# Patient Record
Sex: Male | Born: 1950 | Race: White | Hispanic: No | Smoking: Never smoker
Health system: Southern US, Community
[De-identification: ages and names within clinical notes are randomized; demographics above are authoritative.]

## PROBLEM LIST (undated history)

## (undated) DIAGNOSIS — I10 Essential (primary) hypertension: Secondary | ICD-10-CM

## (undated) DIAGNOSIS — E119 Type 2 diabetes mellitus without complications: Secondary | ICD-10-CM

## (undated) HISTORY — DX: Type 2 diabetes mellitus without complications: E11.9

## (undated) HISTORY — DX: Essential (primary) hypertension: I10

## (undated) HISTORY — PX: HERNIA REPAIR: SHX51

## (undated) HISTORY — PX: COLONOSCOPY: SHX174

---

## 2005-12-30 ENCOUNTER — Encounter (INDEPENDENT_AMBULATORY_CARE_PROVIDER_SITE_OTHER): Payer: Self-pay | Admitting: General Surgery

## 2005-12-30 ENCOUNTER — Ambulatory Visit (HOSPITAL_COMMUNITY): Admission: RE | Admit: 2005-12-30 | Discharge: 2005-12-30 | Payer: Self-pay | Admitting: General Surgery

## 2006-08-25 ENCOUNTER — Ambulatory Visit (HOSPITAL_COMMUNITY): Admission: RE | Admit: 2006-08-25 | Discharge: 2006-08-25 | Payer: Self-pay | Admitting: Family Medicine

## 2007-11-14 ENCOUNTER — Ambulatory Visit (HOSPITAL_COMMUNITY): Payer: Self-pay | Admitting: Psychiatry

## 2007-11-22 ENCOUNTER — Ambulatory Visit (HOSPITAL_COMMUNITY): Payer: Self-pay | Admitting: Psychiatry

## 2007-11-29 ENCOUNTER — Ambulatory Visit (HOSPITAL_COMMUNITY): Payer: Self-pay | Admitting: Psychiatry

## 2007-12-05 ENCOUNTER — Ambulatory Visit (HOSPITAL_COMMUNITY): Payer: Self-pay | Admitting: Psychiatry

## 2007-12-13 ENCOUNTER — Ambulatory Visit (HOSPITAL_COMMUNITY): Payer: Self-pay | Admitting: Psychiatry

## 2007-12-22 ENCOUNTER — Ambulatory Visit (HOSPITAL_COMMUNITY): Payer: Self-pay | Admitting: Psychiatry

## 2007-12-27 ENCOUNTER — Ambulatory Visit (HOSPITAL_COMMUNITY): Payer: Self-pay | Admitting: Psychiatry

## 2007-12-30 ENCOUNTER — Ambulatory Visit (HOSPITAL_COMMUNITY): Payer: Self-pay | Admitting: Psychiatry

## 2008-01-03 ENCOUNTER — Ambulatory Visit (HOSPITAL_COMMUNITY): Payer: Self-pay | Admitting: Psychiatry

## 2008-01-06 ENCOUNTER — Ambulatory Visit (HOSPITAL_COMMUNITY): Payer: Self-pay | Admitting: Psychiatry

## 2008-01-10 ENCOUNTER — Ambulatory Visit (HOSPITAL_COMMUNITY): Payer: Self-pay | Admitting: Psychiatry

## 2008-01-13 ENCOUNTER — Ambulatory Visit (HOSPITAL_COMMUNITY): Payer: Self-pay | Admitting: Psychiatry

## 2008-01-17 ENCOUNTER — Ambulatory Visit (HOSPITAL_COMMUNITY): Payer: Self-pay | Admitting: Psychiatry

## 2008-01-23 ENCOUNTER — Ambulatory Visit (HOSPITAL_COMMUNITY): Payer: Self-pay | Admitting: Psychiatry

## 2008-01-27 ENCOUNTER — Ambulatory Visit (HOSPITAL_COMMUNITY): Payer: Self-pay | Admitting: Psychiatry

## 2008-02-02 ENCOUNTER — Ambulatory Visit (HOSPITAL_COMMUNITY): Payer: Self-pay | Admitting: Psychiatry

## 2008-02-06 ENCOUNTER — Ambulatory Visit (HOSPITAL_COMMUNITY): Payer: Self-pay | Admitting: Psychiatry

## 2008-02-16 ENCOUNTER — Other Ambulatory Visit (HOSPITAL_COMMUNITY): Admission: RE | Admit: 2008-02-16 | Discharge: 2008-03-16 | Payer: Self-pay | Admitting: Psychiatry

## 2008-02-20 ENCOUNTER — Ambulatory Visit: Payer: Self-pay | Admitting: Psychiatry

## 2008-03-15 ENCOUNTER — Ambulatory Visit (HOSPITAL_COMMUNITY): Payer: Self-pay | Admitting: Psychiatry

## 2008-03-20 ENCOUNTER — Ambulatory Visit (HOSPITAL_COMMUNITY): Payer: Self-pay | Admitting: Psychiatry

## 2008-03-27 ENCOUNTER — Ambulatory Visit (HOSPITAL_COMMUNITY): Payer: Self-pay | Admitting: Psychiatry

## 2008-04-03 ENCOUNTER — Ambulatory Visit (HOSPITAL_COMMUNITY): Payer: Self-pay | Admitting: Psychiatry

## 2008-04-10 ENCOUNTER — Ambulatory Visit (HOSPITAL_COMMUNITY): Payer: Self-pay | Admitting: Psychiatry

## 2008-04-16 ENCOUNTER — Inpatient Hospital Stay (HOSPITAL_COMMUNITY): Admission: RE | Admit: 2008-04-16 | Discharge: 2008-04-18 | Payer: Self-pay | Admitting: *Deleted

## 2008-04-16 ENCOUNTER — Emergency Department (HOSPITAL_COMMUNITY): Admission: EM | Admit: 2008-04-16 | Discharge: 2008-04-16 | Payer: Self-pay | Admitting: Emergency Medicine

## 2008-04-16 ENCOUNTER — Ambulatory Visit: Payer: Self-pay | Admitting: *Deleted

## 2008-04-24 ENCOUNTER — Other Ambulatory Visit (HOSPITAL_COMMUNITY): Admission: RE | Admit: 2008-04-24 | Discharge: 2008-07-23 | Payer: Self-pay | Admitting: Psychiatry

## 2008-05-14 ENCOUNTER — Ambulatory Visit: Payer: Self-pay | Admitting: Psychiatry

## 2008-12-08 ENCOUNTER — Emergency Department (HOSPITAL_COMMUNITY): Admission: EM | Admit: 2008-12-08 | Discharge: 2008-12-08 | Payer: Self-pay | Admitting: Emergency Medicine

## 2008-12-13 ENCOUNTER — Ambulatory Visit: Admission: RE | Admit: 2008-12-13 | Discharge: 2008-12-13 | Payer: Self-pay | Admitting: Family Medicine

## 2009-10-29 ENCOUNTER — Ambulatory Visit (HOSPITAL_COMMUNITY): Admission: RE | Admit: 2009-10-29 | Discharge: 2009-10-29 | Payer: Self-pay | Admitting: General Surgery

## 2010-05-27 ENCOUNTER — Ambulatory Visit (HOSPITAL_COMMUNITY): Admission: RE | Admit: 2010-05-27 | Discharge: 2010-05-27 | Payer: Self-pay | Admitting: Psychiatry

## 2010-05-28 ENCOUNTER — Other Ambulatory Visit (HOSPITAL_COMMUNITY): Admission: RE | Admit: 2010-05-28 | Discharge: 2010-06-10 | Payer: Self-pay | Admitting: Psychiatry

## 2010-06-12 ENCOUNTER — Ambulatory Visit: Payer: Self-pay | Admitting: Psychiatry

## 2011-02-20 ENCOUNTER — Other Ambulatory Visit: Payer: Self-pay | Admitting: General Surgery

## 2011-02-20 ENCOUNTER — Encounter (HOSPITAL_COMMUNITY): Payer: 59

## 2011-02-20 LAB — CBC
MCH: 31.4 pg (ref 26.0–34.0)
MCV: 90.3 fL (ref 78.0–100.0)
Platelets: 191 10*3/uL (ref 150–400)
RDW: 13.6 % (ref 11.5–15.5)

## 2011-02-20 LAB — BASIC METABOLIC PANEL
BUN: 12 mg/dL (ref 6–23)
Creatinine, Ser: 0.98 mg/dL (ref 0.4–1.5)
GFR calc non Af Amer: >60 mL/min (ref >60)

## 2011-02-20 LAB — SURGICAL PCR SCREEN: Staphylococcus aureus: NEGATIVE

## 2011-02-25 ENCOUNTER — Ambulatory Visit (HOSPITAL_COMMUNITY)
Admission: RE | Admit: 2011-02-25 | Discharge: 2011-02-25 | Disposition: A | Discharge status: Home / Self Care | Payer: 59 | Source: Ambulatory Visit | Attending: General Surgery | Admitting: General Surgery

## 2011-02-25 DIAGNOSIS — Z79899 Other long term (current) drug therapy: Secondary | ICD-10-CM | POA: Insufficient documentation

## 2011-02-25 DIAGNOSIS — I10 Essential (primary) hypertension: Secondary | ICD-10-CM | POA: Insufficient documentation

## 2011-02-25 DIAGNOSIS — Z01812 Encounter for preprocedural laboratory examination: Secondary | ICD-10-CM | POA: Insufficient documentation

## 2011-02-25 DIAGNOSIS — Z01818 Encounter for other preprocedural examination: Secondary | ICD-10-CM | POA: Insufficient documentation

## 2011-02-25 DIAGNOSIS — K432 Incisional hernia without obstruction or gangrene: Secondary | ICD-10-CM | POA: Insufficient documentation

## 2011-02-26 NOTE — H&P (Signed)
  NAME:  Christian Woodward, MIRARCHI NO.:  [id number]  MEDICAL RECORD NO.:  [id number]           PATIENT TYPE:  LOCATION:                                 FACILITY:  PHYSICIAN:  Dalia Heading, M.D.  DATE OF BIRTH:  June 11, 1951  DATE OF ADMISSION: DATE OF DISCHARGE:  LH                             HISTORY & PHYSICAL   CHIEF COMPLAINT:  Incisional hernia.  HISTORY OF PRESENT ILLNESS:  The patient is a 60 year old white male who is referred for evaluation and treatment of an incisional hernia.  He had an umbilical herniorrhaphy by another physician approximately 3-4 years ago.  The repair has since failed.  It is made worse with straining.  PAST MEDICAL HISTORY:  Includes a history of alcoholism, though he has stopped.  History of pulmonary emboli in January 2010.  CURRENT MEDICATIONS:  Abilify, Levitra.  ALLERGIES:  No known drug allergies.  REVIEW OF SYSTEMS:  The patient denies drinking or smoking.  Denies any other cardiopulmonary difficulties or bleeding disorders.  FAMILY MEDICAL HISTORY:  Noncontributory.  PHYSICAL EXAMINATION:  GENERAL:  The patient is a well-developed, well- nourished white male, in no acute distress. HEENT:  Reveals no scleral icterus. LUNGS:  Clear to auscultation with equal breath sounds bilaterally. HEART:  Reveals regular rate and rhythm without S3, S4, murmurs. ABDOMEN:  Soft, nontender, nondistended.  No hepatosplenomegaly or masses are noted.  A reducible recurrent umbilical hernia is noted.  IMPRESSION:  Incisional hernia, umbilical.  PLAN:  The patient is scheduled for an incisional herniorrhaphy with mesh on February 25, 2011.  The risks and benefits of the procedure including bleeding, infection, and recurrence of the hernia were fully explained to the patient, gave informed consent.     Dalia Heading, M.D.     MAJ/MEDQ  D:  02/17/2011  T:  02/18/2011  Job:  [id number]  cc:   Short Stay, Gary Fleet. Sudie Bailey,  M.D. Fax: [phone]  Electronically Signed by Franky Macho M.D. on 02/26/2011 10:59:18 AM

## 2011-02-26 NOTE — Op Note (Signed)
  NAME:  Christian Woodward, Christian Woodward NO.:  [id number]  MEDICAL RECORD NO.:  [id number]           PATIENT TYPE:  O  LOCATION:  DAYP                          FACILITY:  [facility]  PHYSICIAN:  Dalia Heading, M.D.  DATE OF BIRTH:  10-07-1951  DATE OF PROCEDURE:  02/25/2011 DATE OF DISCHARGE:                              OPERATIVE REPORT   PREOPERATIVE DIAGNOSIS:  Incisional hernia.  POSTOPERATIVE DIAGNOSIS:  Incisional hernia.  PROCEDURE:  Incisional herniorrhaphy with mesh.  SURGEON:  Dalia Heading, MD  ANESTHESIA:  General.  INDICATIONS:  The patient is a 60 year old white male who presents with incisional hernia.  He had a previous umbilical herniorrhaphy without mesh in the past by another surgeon.  The risks and benefits of the procedure including bleeding, infection, the possibility of recurrence of the hernia were fully explained to the patient, he gave informed consent.  PROCEDURE NOTE:  The patient was placed in the supine position.  After general anesthesia was administered, the abdomen was prepped and draped using the usual sterile technique with DuraPrep.  Surgical site confirmation was performed.  An infraumbilical incision was made through the previous surgical scar. The dissection was taken down to the fascia.  The umbilicus was freed away from the underlying fascia and hernia sac.  The patient had 2-3 areas of abdominal wall defects.  These were all connected into one. Some the omentum was excised and freed away from the underlying abdominal wall as well as the hernia sac.  The omental contents were then reduced.  A large-size Proceed ventral patch was then inserted and secured to the fascia using 2-0 Ethibond interrupted sutures.  Some of the soft tissue was then closed over the patch using 2-0 Novofil interrupted sutures.  The base of the umbilicus was secured back to the fascia using 2-0 Vicryl interrupted suture.  Subcutaneous layer  was reapproximated using 3-0 Vicryl interrupted sutures.  The skin was closed using staples.  Sensorcaine 0.5% was instilled into the surrounding wound.  Betadine ointment and dry sterile dressing were applied.  All tape and needle counts were correct at the end of the procedure. The patient was awakened and transferred to PACU in stable condition.  COMPLICATIONS:  None.  SPECIMEN:  None.  ESTIMATED BLOOD LOSS:  Minimal.     Dalia Heading, M.D.     MAJ/MEDQ  D:  02/25/2011  T:  02/25/2011  Job:  [id number]  cc:   Mila Homer. Sudie Bailey, M.D. Fax: [phone]  Electronically Signed by Franky Macho M.D. on 02/26/2011 10:59:20 AM

## 2011-04-28 NOTE — Discharge Summary (Signed)
NAME:  ADITH, TEJADA NO.:  [id number]   MEDICAL RECORD NO.:  [id number]          PATIENT TYPE:  IPS   LOCATION:  0303                          FACILITY:  [facility]   PHYSICIAN:  Jasmine Pang, M.D. DATE OF BIRTH:  [birth year]   DATE OF ADMISSION:  04/16/2008  DATE OF DISCHARGE:  04/18/2008                               DISCHARGE SUMMARY   IDENTIFYING INFORMATION:  A 60 year old white male who is divorced.  This is an involuntary admission.   HISTORY OF PRESENT ILLNESS:  This patient was referred by his  psychotherapist, Florencia Reasons, in the [place] office of the [facility].  He had expressed to her ongoing thoughts of suicide.  Has been having these for about 2 weeks, and said that he had gone ahead  and given all of his guns to a friend.  Not sure that he could be safe  at home.  He was transferred over the [facility] Emergency Room and then  decided that he needed to go home, feed his cat, pack a bag.  Was told  he could not leave the emergency room and out of concern for his safety  he was involuntarily petitioned.  He is unable to cite any particular  stressors that he believes have triggered his depression.  He has been  on Cymbalta since he attended our intensive outpatient program at the  beginning of April and is currently taking 60 mg per day.  Has felt that  it has not been helpful and denies any hallucinations.  Does endorse  having decreased sleep with a lot of ruminating thought, wanting to  worry all night long, turning his worries over and over in his mind,  falling asleep late into the night, then awakens and says that his  worrying begins to escalate and gets very anxious in the morning after  he first wakes up facing the day.  Was prescribed Xanax 1 mg and takes 1  in the morning and typically 1 at night.  No new social stressors.  Denying any homicidal thoughts.  No hallucinations.  No subjective  feelings of paranoia.  Endorses a  lot of anhedonia, dysphoric mood,  feeling lonely about his future since divorcing his wife.   PAST PSYCHIATRIC HISTORY:  First inpatient psychiatric admission.  He  sees Florencia Reasons for counseling and Dr. Kathryne Sharper in the [facility] in [place].  First diagnosed with depression in  January of 2009 after his wife of 37 years divorced him.  He wanted to  maintain the relationship but she would not reconcile.  Denies any  history of mood disorder in childhood.  No history of learning  disability, brain injury or substance abuse.  Initially in January was  treated with Wellbutrin, which he felt did not work at all.  Also at one  point was treated with Lexapro, that he felt did not work.  Obtained  counseling in our intensive outpatient program in April and felt that  this was very helpful.  At that time was started on Cymbalta and is  taking 60 mg  daily.   SOCIAL HISTORY:  A baccalaureate degree in business management.  Works  full time for [employer] in [place].  Lives there in  his own home.  Has a supportive friend that he is quite close to,  cares  for his cat.  Endorses some social isolation.  No legal problems.  No  history of substance abuse.   MEDICAL HISTORY:  No regular primary care physician.  Denies chronic  medical problems.   CURRENT MEDICATIONS:  Xanax 1 mg up to t.i.d. p.r.n. for anxiety,  typically uses 1 a.m. and h.s.; Seroquel 100 mg p.o. q.h.s.; and  Cymbalta 60 mg daily.   DRUG ALLERGIES:  NO KNOWN DRUG ALLERGIES.   PHYSICAL EXAM:  Was done in the [facility] Emergency Room with  diagnostic studies including a CMET, CBC, and were all within normal  limits.   ADMITTING MENTAL STATUS EXAM:  Revealed a fully alert male, cooperative,  grim affect, quite angry about being involuntarily petitioned although  he was polite at all times and cooperative with staff.  Speech normal in  pace, tone and amount.  He is articulate.  In addition  to being  involuntary petitioned, he was apparently diverted to the jail where he  was cuffed and secured to a wall, unable to eat or drink for several  hours.  Cooperative with treatment though, willing to discuss some  alternatives for care.  Mood depressed and irritable.  Thought process  logical and coherent.  Denying any active suicidal intent today.  Admits  to having 2 weeks of vague suicidal thoughts.  Cognition was fully  intact.  He is in full contact with reality.  No inappropriate  statements, no evidence of psychosis.   ADMITTING DIAGNOSIS:  AXIS I:  Major depression, recurrent.  AXIS II:  Deferred.  AXIS III:  No diagnosis.  AXIS IV: Chronic issues with loneliness, grieving breakup of the  relationship, and some social isolation.  AXIS V:  Current 47, past year 54 estimated.   COURSE OF HOSPITALIZATION:  The patient was admitted to our depression  group with a goal of stabilizing him and alleviating his suicidal  thoughts.  Initially he was quite preoccupied with the involuntary  petition process, feeling that it had been unjust when he was willing to  sign himself voluntarily, so we released him from the involuntary  petition and he signed in voluntarily.  Reviewed current symptoms.  He  is not convinced that the Cymbalta was working.  He agreed to try Zoloft  and we started him on 50 mg q.a.m.  By the morning of May 6th, 2009 he  had slept fairly well, denying any intent to harm himself or anyone  else, willing to follow up as an outpatient, feeling that he could go  home and be safe.  Mental status exam revealed a fully alert male in  full contact with reality with neutral mood, linear thinking, logical,  coherent, denying any suicidal thought, wanting to return to our  intensive outpatient program and willing to start there on Apr 19, 2008  at 9 a.m.  Meanwhile, he had taken a dose of Zoloft with no adverse  reactions.  Insight good.  Impulse control and judgment  within normal  limits.  He was concerned about getting back to work, his truck was  parked at the hospital, securing his various personal possessions.  We  agreed to discharge him.   DISCHARGE MEDICATIONS:  Zoloft 50 mg daily,  Cymbalta is discontinued,  Seroquel 100 mg at bedtime, Xanax 1 mg up to 3 times a day as needed for  anxiety.      Margaret A. Lorin Picket, N.P.      Jasmine Pang, M.D.  Electronically Signed    MAS/MEDQ  D:  04/18/2008  T:  04/18/2008  Job:  [id number]

## 2011-04-28 NOTE — Procedures (Signed)
NAME:  Christian Woodward, Christian Woodward             ACCOUNT NO.:  [id number]   MEDICAL RECORD NO.:  [id number]          PATIENT TYPE:  OUT   LOCATION:  SLEE                          FACILITY:  [facility]   PHYSICIAN:  Kofi A. Gerilyn Pilgrim, M.D. DATE OF BIRTH:  03-Dec-1951   DATE OF PROCEDURE:  12/13/2008  DATE OF DISCHARGE:  12/13/2008                             SLEEP DISORDER REPORT   NOCTURNAL POLYSOMNOGRAPHY REPORT.   REFERRING PHYSICIAN:  Mila Homer. Sudie Bailey, MD   INDICATIONS:  This is a 60 year old man who presents with snoring,  witnessed apnea, daytime sleepiness.  He is being evaluated for  obstructive sleep apnea syndrome.   MEDICATIONS:  Alprazolam, Ambien, Risperdal, and trazodone.   EPWORTH SLEEPINESS SCALE:  1. BMI of 39.   ARCHITECTURAL SUMMARY:  This is a split-night study with the first  portion being a diagnostic and a second a titration.  The total  recording time and the diagnostics 243 minutes and 220 minutes in the  titration.  The sleep efficiency is 86% and the diagnostic 91% in the  titration.  The sleep latency is 8.5 minutes.  No REM is observed in the  diagnostic portion.   RESPIRATORY SUMMARY:  The baseline oxygen saturation is 94%, the lowest  saturation is 86%.  The AHI in the diagnostic portion is 17.  The  patient was titrated between pressures 6 and 11 with the pressure of 10  being the optimal pressure with resolution of events and snoring.   LIMB MOVEMENT SUMMARY:  PLM index 0.   ELECTROCARDIOGRAM SUMMARY:  Average heart rate is 89 with isolated PVCs  observed.   IMPRESSION:  Moderate obstructive sleep apnea syndrome which responds  well to a CPAP.   Thanks for this referral.      Kofi A. Gerilyn Pilgrim, M.D.  Electronically Signed    KAD/MEDQ  D:  12/16/2008  T:  12/16/2008  Job:  [id number]

## 2011-04-28 NOTE — Group Therapy Note (Signed)
NAME:  Christian Woodward, Christian Woodward NO.:  [id number]   MEDICAL RECORD NO.:  [id number]          PATIENT TYPE:  EMS   LOCATION:  ED                            FACILITY:  [facility]   PHYSICIAN:  Mila Homer. Sudie Bailey, M.D.DATE OF BIRTH:  November 04, 1951   DATE OF PROCEDURE:  DATE OF DISCHARGE:  12/08/2008                                 PROGRESS NOTE   SUBJECTIVE:  The patient's wife called me today saying that he had been  quite confused in the last few days, also been coughing.  I found that  later in the emergency room that his alprazolam 1 mg q.i.d. had been  changed to 2 mg q.i.d. about 3 weeks before at a mental health facility  he went to.  However, the patient said he was too knocked out by the  drug and actually only took it half a dose sometimes and just a full  dose at night.  He also had been put on trazodone for sleep, but felt  this was too expensive.  He still had risperidone 1 mg to use b.i.d.,  sertraline 100 mg daily, as well as the alprazolam.   The patient did admit to having a cough.  He really had no fever,  chills, no sputum production.  He has had long history of intermittent  depression very often related to job situation, some over use of  alcohol.  He has had some swelling of his feet and ankles for the last 2-  3 weeks which is new for him.   OBJECTIVE:  VITAL SIGNS:  Temperature 97.38, blood pressure 145/95,  pulse 101, respiratory rate 22, O2 sats 96% on  room air.  GENERAL:  He is well-developed, obese, middle-aged man in no acute  distress.  He is oriented and alert.  Sentence structure is intact.  Sensorium appears normal.  There is no slurring of his speech.  LUNGS:  Actually clear throughout.  He moves air well.  There are no  intercostal retractions.  There is no use of accessory muscle for  respiration.  HEART:  Regular rhythm without murmur, rate of about 80.  He does have  1+ pitting edema of the ankles and feet however.  Feet are warm.   LABORATORY DATA:  His CBC showed a white cell count of 6100 of which 58%  neutrophils, 31% lymphocytes.  His hemoglobin was 14.9.  Platelet count  to 204,000.  CMP showed BUN 14, creatinine 1.00, albumin 4.0.  Blood gas  on room air showed pH of 7.41, pCO2 33, pO2 78, bicarb 21.   ASSESSMENT:  1. Mild confusion probably secondary to over use of benzodiazepines.  2. Edema.  3. Reactive depression.  4. History of ethyl alcohol overuse.  5. Obesity.   PLAN:  I talked to the patient's wife at length.  I am putting him on  furosemide 40 mg daily for fluid, #30, no refills along KCl 20 mEq daily  with the furosemide 30 no refills.  I have also put him on temazepam 30  mg two nightly for  sleep, #60 with 5 refills, since this  has helped his sleep more than  anything.  He is to cut the alprazolam down to 1 mg half a tablet t.i.d.  and 2 mg nightly to use with the temazepam for sleep.  Followup with me  in the office within the next 1-2 weeks.      Mila Homer. Sudie Bailey, M.D.  Electronically Signed     SDK/MEDQ  D:  12/09/2008  T:  12/09/2008  Job:  [id number]

## 2011-05-01 NOTE — Op Note (Signed)
NAME:  Christian Woodward, Christian Woodward NO.:  [id number]   MEDICAL RECORD NO.:  [id number]          PATIENT TYPE:  AMB   LOCATION:  DAY                           FACILITY:  [facility]   PHYSICIAN:  Barbaraann Barthel, M.D. DATE OF BIRTH:  1951/08/01   DATE OF PROCEDURE:  12/30/2005  DATE OF DISCHARGE:                                 OPERATIVE REPORT   SURGEON:  Dr. Malvin Johns.   POSTOPERATIVE DIAGNOSIS:  Umbilical hernia   PROCEDURE:  Umbilical herniorrhaphy without the use of mesh.   SPECIMEN:  Umbilical hernia sac.   NOTE:  This is a 60 year old heavy-set white male who presented with an  umbilical hernia, and this appeared to be clinically quite larger than it  was found to be intraoperatively. He also had some erosion over the skin and  some attenuation of the skin which made me think that if mesh would be  needed I would, to avoid a greater probability of infection, have to remove  that area of skin. However, this was not necessary.   GROSS OPERATIVE FINDINGS:  The patient had a defect approximately the size  of 50 cent piece in a subcutaneous pocket where the hernia passed through  the abdominal cavity giving the impression that this was a larger hernia  than it indeed was. The skin was much healed and no removal of the umbilicus  was necessary.   TECHNIQUE:  The patient was placed in a supine position. After adequate  administration of general anesthesia via endotracheal intubation, his entire  abdomen was prepped with Betadine solution and draped in usual manner. A  curvilinear incision was carried out below the umbilicus through skin,  subcutaneous tissue down to the fascia. The hernia sac was dissected free  from the umbilical skin. The hernia sac was removed, and the fascia was  approximated with #1 Prolene sutures in a horizontal mattress suturing  technique. We then oversewed this with a 0 Prolene to do a two-layer  closure. After checking for hemostasis, the wound was  then irrigated. The  subcutaneous tissue was closed with 3-0 Polysorb and tacking the umbilicus  to the fascial tissues restoring its concave appearance. I used  approximately 8 cc of 1/2% Sensorcaine around the wound to help with  postoperative comfort. The skin was approximated with the stapling device.  Neosporin, 4x4s and a sterile dressing was applied. Prior to closure, all  sponge views were counts found to be correct. Estimated blood loss was  minimal. The patient received 650 cc of crystalloids intraoperatively. There  were no complications.      Barbaraann Barthel, M.D.  Electronically Signed    WB/MEDQ  D:  12/30/2005  T:  12/30/2005  Job:  [id number]

## 2011-09-07 LAB — URINE DRUGS OF ABUSE SCREEN W ALC, ROUTINE (REF LAB)
Amphetamine Screen, Ur: NEGATIVE
Barbiturate Quant, Ur: NEGATIVE
Benzodiazepines.: POSITIVE — AB
Cocaine Metabolites: NEGATIVE
Creatinine,U: 87.8
Ethyl Alcohol: <5
Marijuana Metabolite: NEGATIVE
Methadone: NEGATIVE
Opiate Screen, Urine: NEGATIVE
Phencyclidine (PCP): NEGATIVE
Propoxyphene: NEGATIVE

## 2011-09-07 LAB — BENZODIAZEPINE, QUANTITATIVE, URINE
Alprazolam (GC/LC/MS), ur confirm: 130 ng/mL
Flurazepam GC/MS Conf: NEGATIVE
Nordiazepam GC/MS Conf: NEGATIVE
Oxazepam GC/MS Conf: NEGATIVE

## 2011-09-18 LAB — COMPREHENSIVE METABOLIC PANEL
ALT: 30 U/L (ref 0–53)
AST: 25 U/L (ref 0–37)
Alkaline Phosphatase: 72 U/L (ref 39–117)
CO2: 23 mEq/L (ref 19–32)
Calcium: 9.1 mg/dL (ref 8.4–10.5)
Chloride: 106 mEq/L (ref 96–112)
GFR calc non Af Amer: >60 mL/min (ref >60)
Glucose, Bld: 214 mg/dL — ABNORMAL HIGH (ref 70–99)
Sodium: 137 mEq/L (ref 135–145)
Total Bilirubin: 0.4 mg/dL (ref 0.3–1.2)

## 2011-09-18 LAB — DIFFERENTIAL
Basophils Absolute: 0 10*3/uL (ref 0.0–0.1)
Basophils Relative: 1 % (ref 0–1)
Eosinophils Absolute: 0.1 10*3/uL (ref 0.0–0.7)
Eosinophils Relative: 2 % (ref 0–5)
Lymphs Abs: 1.9 10*3/uL (ref 0.7–4.0)
Neutrophils Relative %: 58 % (ref 43–77)

## 2011-09-18 LAB — CBC
Hemoglobin: 14.9 g/dL (ref 13.0–17.0)
MCHC: 34.3 g/dL (ref 30.0–36.0)
RBC: 4.82 MIL/uL (ref 4.22–5.81)
WBC: 6.1 10*3/uL (ref 4.0–10.5)

## 2011-09-18 LAB — BLOOD GAS, ARTERIAL
FIO2: 21 %
O2 Saturation: 96.1 %
Patient temperature: 37
pH, Arterial: 7.418 (ref 7.350–7.450)

## 2018-12-16 DIAGNOSIS — J189 Pneumonia, unspecified organism: Secondary | ICD-10-CM | POA: Diagnosis not present

## 2018-12-20 DIAGNOSIS — E781 Pure hyperglyceridemia: Secondary | ICD-10-CM | POA: Diagnosis not present

## 2018-12-20 DIAGNOSIS — I10 Essential (primary) hypertension: Secondary | ICD-10-CM | POA: Diagnosis not present

## 2018-12-20 DIAGNOSIS — G4700 Insomnia, unspecified: Secondary | ICD-10-CM | POA: Diagnosis not present

## 2018-12-20 DIAGNOSIS — E1165 Type 2 diabetes mellitus with hyperglycemia: Secondary | ICD-10-CM | POA: Diagnosis not present

## 2020-01-26 DIAGNOSIS — I10 Essential (primary) hypertension: Secondary | ICD-10-CM | POA: Diagnosis not present

## 2020-01-26 DIAGNOSIS — G4700 Insomnia, unspecified: Secondary | ICD-10-CM | POA: Diagnosis not present

## 2020-01-26 DIAGNOSIS — N529 Male erectile dysfunction, unspecified: Secondary | ICD-10-CM | POA: Diagnosis not present

## 2020-01-26 DIAGNOSIS — E1165 Type 2 diabetes mellitus with hyperglycemia: Secondary | ICD-10-CM | POA: Diagnosis not present

## 2020-01-29 DIAGNOSIS — I10 Essential (primary) hypertension: Secondary | ICD-10-CM | POA: Diagnosis not present

## 2020-01-29 DIAGNOSIS — E1165 Type 2 diabetes mellitus with hyperglycemia: Secondary | ICD-10-CM | POA: Diagnosis not present

## 2020-01-29 DIAGNOSIS — Z125 Encounter for screening for malignant neoplasm of prostate: Secondary | ICD-10-CM | POA: Diagnosis not present

## 2020-01-29 DIAGNOSIS — E781 Pure hyperglyceridemia: Secondary | ICD-10-CM | POA: Diagnosis not present

## 2020-02-02 DIAGNOSIS — E1165 Type 2 diabetes mellitus with hyperglycemia: Secondary | ICD-10-CM | POA: Diagnosis not present

## 2020-02-02 DIAGNOSIS — E6609 Other obesity due to excess calories: Secondary | ICD-10-CM | POA: Diagnosis not present

## 2020-04-30 DIAGNOSIS — E1165 Type 2 diabetes mellitus with hyperglycemia: Secondary | ICD-10-CM | POA: Diagnosis not present

## 2020-04-30 DIAGNOSIS — I10 Essential (primary) hypertension: Secondary | ICD-10-CM | POA: Diagnosis not present

## 2020-04-30 DIAGNOSIS — E781 Pure hyperglyceridemia: Secondary | ICD-10-CM | POA: Diagnosis not present

## 2020-04-30 DIAGNOSIS — E6609 Other obesity due to excess calories: Secondary | ICD-10-CM | POA: Diagnosis not present

## 2020-05-03 DIAGNOSIS — E1165 Type 2 diabetes mellitus with hyperglycemia: Secondary | ICD-10-CM | POA: Diagnosis not present

## 2020-05-03 DIAGNOSIS — G4700 Insomnia, unspecified: Secondary | ICD-10-CM | POA: Diagnosis not present

## 2020-05-03 DIAGNOSIS — I10 Essential (primary) hypertension: Secondary | ICD-10-CM | POA: Diagnosis not present

## 2020-05-03 DIAGNOSIS — E781 Pure hyperglyceridemia: Secondary | ICD-10-CM | POA: Diagnosis not present

## 2020-07-22 ENCOUNTER — Other Ambulatory Visit (HOSPITAL_COMMUNITY): Payer: Self-pay | Admitting: Family Medicine

## 2020-07-22 DIAGNOSIS — M533 Sacrococcygeal disorders, not elsewhere classified: Secondary | ICD-10-CM

## 2020-07-22 DIAGNOSIS — I10 Essential (primary) hypertension: Secondary | ICD-10-CM | POA: Diagnosis not present

## 2020-07-22 DIAGNOSIS — E1165 Type 2 diabetes mellitus with hyperglycemia: Secondary | ICD-10-CM | POA: Diagnosis not present

## 2020-07-22 DIAGNOSIS — M5441 Lumbago with sciatica, right side: Secondary | ICD-10-CM | POA: Diagnosis not present

## 2020-07-22 DIAGNOSIS — M545 Low back pain, unspecified: Secondary | ICD-10-CM

## 2020-07-22 DIAGNOSIS — K921 Melena: Secondary | ICD-10-CM | POA: Diagnosis not present

## 2020-07-24 ENCOUNTER — Ambulatory Visit (HOSPITAL_COMMUNITY)
Admission: RE | Admit: 2020-07-24 | Discharge: 2020-07-24 | Disposition: A | Discharge status: Home / Self Care | Payer: PPO | Source: Ambulatory Visit | Attending: Family Medicine | Admitting: Family Medicine

## 2020-07-24 ENCOUNTER — Other Ambulatory Visit: Payer: Self-pay

## 2020-07-24 DIAGNOSIS — M545 Low back pain, unspecified: Secondary | ICD-10-CM

## 2020-07-24 DIAGNOSIS — M533 Sacrococcygeal disorders, not elsewhere classified: Secondary | ICD-10-CM | POA: Insufficient documentation

## 2020-08-02 ENCOUNTER — Encounter: Payer: Self-pay | Admitting: Internal Medicine

## 2020-08-05 DIAGNOSIS — I10 Essential (primary) hypertension: Secondary | ICD-10-CM | POA: Diagnosis not present

## 2020-08-05 DIAGNOSIS — E6609 Other obesity due to excess calories: Secondary | ICD-10-CM | POA: Diagnosis not present

## 2020-08-05 DIAGNOSIS — Z125 Encounter for screening for malignant neoplasm of prostate: Secondary | ICD-10-CM | POA: Diagnosis not present

## 2020-08-05 DIAGNOSIS — E1165 Type 2 diabetes mellitus with hyperglycemia: Secondary | ICD-10-CM | POA: Diagnosis not present

## 2020-08-05 DIAGNOSIS — E781 Pure hyperglyceridemia: Secondary | ICD-10-CM | POA: Diagnosis not present

## 2020-08-13 ENCOUNTER — Other Ambulatory Visit (HOSPITAL_COMMUNITY): Payer: Self-pay | Admitting: Family Medicine

## 2020-08-13 ENCOUNTER — Other Ambulatory Visit: Payer: Self-pay | Admitting: Family Medicine

## 2020-08-13 DIAGNOSIS — M5441 Lumbago with sciatica, right side: Secondary | ICD-10-CM

## 2020-08-27 ENCOUNTER — Ambulatory Visit (HOSPITAL_COMMUNITY): Payer: PPO

## 2020-08-28 ENCOUNTER — Ambulatory Visit (HOSPITAL_COMMUNITY)
Admission: RE | Admit: 2020-08-28 | Discharge: 2020-08-28 | Disposition: A | Discharge status: Home / Self Care | Payer: PPO | Source: Ambulatory Visit | Attending: Family Medicine | Admitting: Family Medicine

## 2020-08-28 ENCOUNTER — Other Ambulatory Visit: Payer: Self-pay

## 2020-08-28 DIAGNOSIS — M5441 Lumbago with sciatica, right side: Secondary | ICD-10-CM | POA: Insufficient documentation

## 2020-08-28 DIAGNOSIS — M545 Low back pain: Secondary | ICD-10-CM | POA: Diagnosis not present

## 2020-09-05 ENCOUNTER — Telehealth: Payer: Self-pay | Admitting: *Deleted

## 2020-09-05 ENCOUNTER — Ambulatory Visit: Payer: PPO | Admitting: Internal Medicine

## 2020-09-05 ENCOUNTER — Other Ambulatory Visit: Payer: Self-pay

## 2020-09-05 ENCOUNTER — Encounter: Payer: Self-pay | Admitting: Internal Medicine

## 2020-09-05 DIAGNOSIS — Z1211 Encounter for screening for malignant neoplasm of colon: Secondary | ICD-10-CM

## 2020-09-05 DIAGNOSIS — K219 Gastro-esophageal reflux disease without esophagitis: Secondary | ICD-10-CM | POA: Insufficient documentation

## 2020-09-05 NOTE — Progress Notes (Signed)
Primary Care Physician:  Lemmie Evens, MD Primary Gastroenterologist:  Dr. Abbey Chatters  Chief Complaint  Patient presents with  . Blood In Stools    last TCS 2010. has not had episode in few months. Relates to taking 10-12 aspirins per day. He has stopped doing this and since no more blood in stool    HPI:   Christian Woodward is a 69 y.o. male who presents to the clinic today by referral from his PCP Dr. Karie Kirks for evaluation.  Patient states his last colonoscopy was in 2010 by Dr. Arnoldo Morale which was unremarkable.  Patient is adopted, so colorectal family history is unknown.  No melena or hematochezia.  No abdominal pain.  No unintentional weight loss.  Does admit to occasional reflux which appears to be diet related.  Every now and then he will have to take Rolaids for heartburn.  Denies any dysphagia or odynophagia.  No regurgitation of partially foods.  Otherwise he has no other complaints.  Past Medical History:  Diagnosis Date  . Diabetes ([place])   . HTN (hypertension)     Past Surgical History:  Procedure Laterality Date  . HERNIA REPAIR      Current Outpatient Medications  Medication Sig Dispense Refill  . diltiazem (CARDIZEM) 120 MG tablet Take 120 mg by mouth 2 (two) times daily.    Marland Kitchen glipiZIDE (GLUCOTROL XL) 10 MG 24 hr tablet Take 10 mg by mouth daily.    Marland Kitchen lisinopril (ZESTRIL) 20 MG tablet Take 20 mg by mouth daily.    . metFORMIN (GLUCOPHAGE-XR) 500 MG 24 hr tablet Take 1,000 mg by mouth 2 (two) times daily.    . pioglitazone (ACTOS) 45 MG tablet Take 45 mg by mouth every morning.    . pravastatin (PRAVACHOL) 40 MG tablet Take 40 mg by mouth daily.     No current facility-administered medications for this visit.    Allergies as of 09/05/2020  . (No Known Allergies)    Family History  Adopted: Yes    Social History   Socioeconomic History  . Marital status: Married    Spouse name: Not on file  . Number of children: Not on file  . Years of education: Not  on file  . Highest education level: Not on file  Occupational History  . Not on file  Tobacco Use  . Smoking status: Never Smoker  . Smokeless tobacco: Never Used  Substance and Sexual Activity  . Alcohol use: Never  . Drug use: Never  . Sexual activity: Not on file  Other Topics Concern  . Not on file  Social History Narrative  . Not on file   Social Determinants of Health   Financial Resource Strain:   . Difficulty of Paying Living Expenses: Not on file  Food Insecurity:   . Worried About Charity fundraiser in the Last Year: Not on file  . Ran Out of Food in the Last Year: Not on file  Transportation Needs:   . Lack of Transportation (Medical): Not on file  . Lack of Transportation (Non-Medical): Not on file  Physical Activity:   . Days of Exercise per Week: Not on file  . Minutes of Exercise per Session: Not on file  Stress:   . Feeling of Stress : Not on file  Social Connections:   . Frequency of Communication with Friends and Family: Not on file  . Frequency of Social Gatherings with Friends and Family: Not on file  . Attends Religious  Services: Not on file  . Active Member of Clubs or Organizations: Not on file  . Attends Archivist Meetings: Not on file  . Marital Status: Not on file  Intimate Partner Violence:   . Fear of Current or Ex-Partner: Not on file  . Emotionally Abused: Not on file  . Physically Abused: Not on file  . Sexually Abused: Not on file    Subjective: Review of Systems  Constitutional: Negative for chills and fever.  HENT: Negative for congestion and hearing loss.   Eyes: Negative for blurred vision and double vision.  Respiratory: Negative for cough and shortness of breath.   Cardiovascular: Negative for chest pain and palpitations.  Gastrointestinal: Positive for heartburn. Negative for abdominal pain, blood in stool, constipation, diarrhea, melena and vomiting.  Genitourinary: Negative for dysuria and urgency.    Musculoskeletal: Negative for joint pain and myalgias.  Skin: Negative for itching and rash.  Neurological: Negative for dizziness and headaches.  Psychiatric/Behavioral: Negative for depression. The patient is not nervous/anxious.        Objective: BP 120/76   Pulse 91   Temp (!) 97.3 F (36.3 C) (Oral)   Ht 5\' 9"  (1.753 m)   Wt 255 lb 9.6 oz (115.9 kg)   BMI 37.75 kg/m  Physical Exam Constitutional:      Appearance: Normal appearance. He is obese.  HENT:     Head: Normocephalic and atraumatic.  Eyes:     Extraocular Movements: Extraocular movements intact.     Conjunctiva/sclera: Conjunctivae normal.  Cardiovascular:     Rate and Rhythm: Normal rate and regular rhythm.  Pulmonary:     Effort: Pulmonary effort is normal.     Breath sounds: Normal breath sounds.  Abdominal:     General: Bowel sounds are normal.     Palpations: Abdomen is soft.  Musculoskeletal:        General: Normal range of motion.     Cervical back: Normal range of motion and neck supple.  Skin:    General: Skin is warm.  Neurological:     General: No focal deficit present.     Mental Status: He is alert and oriented to person, place, and time.  Psychiatric:        Mood and Affect: Mood normal.        Behavior: Behavior normal.      Assessment: *Colon cancer screening *Reflux-intermittent  Plan: Will schedule for screening colonoscopy.The risks including infection, bleed, or perforation as well as benefits, limitations, alternatives and imponderables have been reviewed with the patient. Questions have been answered. All parties agreeable.  Reflux appears to be very mild and intermittent nature.  Continue to take Tums as needed.  We will print out home practices to decrease reflux.  Thank you Dr. Karie Kirks for the kind referral.   09/05/2020 11:41 AM   Disclaimer: This note was dictated with voice recognition software. Similar sounding words can inadvertently be transcribed and may not  be corrected upon review.

## 2020-09-05 NOTE — Telephone Encounter (Signed)
Pt returned call. He has been scheduled for 11/30 at 10:00am. Pt [facility] will mail instructions with pre-op/covid test appt.

## 2020-09-05 NOTE — Patient Instructions (Signed)
We will schedule you for a screening colonoscopy today in clinic.  Further recommendations to follow.  Lifestyle and home remedies TO MANAGE REFLUX/HEARTBURN    You may eliminate or reduce the frequency of heartburn by making the following lifestyle changes:    Control your weight. Being overweight is a major risk factor for heartburn and GERD. Excess pounds put pressure on your abdomen, pushing up your stomach and causing acid to back up into your esophagus.     Eat smaller meals. 4 TO 6 MEALS A DAY. This reduces pressure on the lower esophageal sphincter, helping to prevent the valve from opening and acid from washing back into your esophagus.      Loosen your belt. Clothes that fit tightly around your waist put pressure on your abdomen and the lower esophageal sphincter.      Eliminate heartburn triggers. Everyone has specific triggers. Common triggers such as fatty or fried foods, spicy food, tomato sauce, carbonated beverages, alcohol, chocolate, mint, garlic, onion, caffeine and nicotine may make heartburn worse.     Avoid stooping or bending. Tying your shoes is OK. Bending over for longer periods to weed your garden isn't, especially soon after eating.     Don't lie down after a meal. Wait at least three to four hours after eating before going to bed, and don't lie down right after eating.   At [facility] we value your feedback. You may receive a survey about your visit today. Please share your experience as we strive to create trusting relationships with our patients to provide genuine, compassionate, quality care.  We appreciate your understanding and patience as we review any laboratory studies, imaging, and other diagnostic tests that are ordered as we care for you. Our office policy is 5 business days for review of these results, and any emergent or urgent results are addressed in a timely manner for your best interest. If you do not hear from our office in  1 week, please contact us.   We also encourage the use of MyChart, which contains your medical information for your review as well. If you are not enrolled in this feature, an access code is on this after visit summary for your convenience. Thank you for allowing [place] to be involved in your care.  It was great to see you today!  I hope you have a great rest of your fall!!  Shaye Lagace K. Abbey Chatters, D.O. Gastroenterology and Hepatology [facility]

## 2020-09-05 NOTE — Telephone Encounter (Signed)
LMOVM to call back to scheudle TCS with Dr. Abbey Chatters, ASA 3

## 2020-09-06 ENCOUNTER — Encounter: Payer: Self-pay | Admitting: *Deleted

## 2020-09-11 ENCOUNTER — Encounter (INDEPENDENT_AMBULATORY_CARE_PROVIDER_SITE_OTHER): Payer: Self-pay | Admitting: Gastroenterology

## 2020-10-03 ENCOUNTER — Telehealth: Payer: Self-pay | Admitting: *Deleted

## 2020-10-03 NOTE — Telephone Encounter (Signed)
Received VM from pt that he needs to r/s procedure on 11/30. He has not been scheduled for 12/28 at 7:30am. Aware will mail new instrucitons with new pre-op/covid test appt. Called endo and made aware

## 2020-10-14 ENCOUNTER — Ambulatory Visit (INDEPENDENT_AMBULATORY_CARE_PROVIDER_SITE_OTHER): Payer: PPO | Admitting: Gastroenterology

## 2020-11-11 ENCOUNTER — Other Ambulatory Visit (HOSPITAL_COMMUNITY): Payer: PPO

## 2020-12-04 NOTE — Patient Instructions (Addendum)
Your procedure is scheduled on: 12/10/2020  Report to [place] Entrance at    6:15 AM.  Call this number if you have problems the morning of surgery: [phone]   Remember:              Follow Directions on the letter you received from Your Physician's office regarding the Bowel Prep              No Smoking the day of Procedure :   Take these medicines the morning of surgery with A SIP OF WATER: Diltiazem No diabetic medication morning of procedure   Do not wear jewelry, make-up or nail polish.    Do not bring valuables to the hospital.  Contacts, dentures or bridgework may not be worn into surgery.  .   Patients discharged the day of surgery will not be allowed to drive home.     Colonoscopy, Adult, Care After This sheet gives you information about how to care for yourself after your procedure. Your health care provider may also give you more specific instructions. If you have problems or questions, contact your health care provider. What can I expect after the procedure? After the procedure, it is common to have:  A small amount of blood in your stool for 24 hours after the procedure.  Some gas.  Mild abdominal cramping or bloating.  Follow these instructions at home: General instructions   For the first 24 hours after the procedure: ? Do not drive or use machinery. ? Do not sign important documents. ? Do not drink alcohol. ? Do your regular daily activities at a slower pace than normal. ? Eat soft, easy-to-digest foods. ? Rest often.  Take over-the-counter or prescription medicines only as told by your health care provider.  It is up to you to get the results of your procedure. Ask your health care provider, or the department performing the procedure, when your results will be ready. Relieving cramping and bloating  Try walking around when you have cramps or feel bloated.  Apply heat to your abdomen as told by your health care provider. Use a heat  source that your health care provider recommends, such as a moist heat pack or a heating pad. ? Place a towel between your skin and the heat source. ? Leave the heat on for 20-30 minutes. ? Remove the heat if your skin turns bright red. This is especially important if you are unable to feel pain, heat, or cold. You may have a greater risk of getting burned. Eating and drinking  Drink enough fluid to keep your urine clear or pale yellow.  Resume your normal diet as instructed by your health care provider. Avoid heavy or fried foods that are hard to digest.  Avoid drinking alcohol for as long as instructed by your health care provider. Contact a health care provider if:  You have blood in your stool 2-3 days after the procedure. Get help right away if:  You have more than a small spotting of blood in your stool.  You pass large blood clots in your stool.  Your abdomen is swollen.  You have nausea or vomiting.  You have a fever.  You have increasing abdominal pain that is not relieved with medicine. This information is not intended to replace advice given to you by your health care provider. Make sure you discuss any questions you have with your health care provider. Document Released: 07/14/2004 Document Revised: 08/24/2016 Document Reviewed: 02/11/2016 Elsevier  Interactive Patient Education  [employer].

## 2020-12-05 ENCOUNTER — Other Ambulatory Visit (HOSPITAL_COMMUNITY)
Admission: RE | Admit: 2020-12-05 | Discharge: 2020-12-05 | Disposition: A | Discharge status: Home / Self Care | Payer: PPO | Source: Ambulatory Visit | Attending: Internal Medicine | Admitting: Internal Medicine

## 2020-12-05 ENCOUNTER — Other Ambulatory Visit: Payer: Self-pay

## 2020-12-05 ENCOUNTER — Encounter (HOSPITAL_COMMUNITY): Payer: Self-pay

## 2020-12-05 ENCOUNTER — Encounter (HOSPITAL_COMMUNITY)
Admission: RE | Admit: 2020-12-05 | Discharge: 2020-12-05 | Disposition: A | Discharge status: Home / Self Care | Payer: PPO | Source: Ambulatory Visit | Attending: Internal Medicine | Admitting: Internal Medicine

## 2020-12-05 DIAGNOSIS — Z20822 Contact with and (suspected) exposure to covid-19: Secondary | ICD-10-CM | POA: Insufficient documentation

## 2020-12-05 DIAGNOSIS — I10 Essential (primary) hypertension: Secondary | ICD-10-CM | POA: Diagnosis not present

## 2020-12-05 DIAGNOSIS — Z01818 Encounter for other preprocedural examination: Secondary | ICD-10-CM | POA: Insufficient documentation

## 2020-12-06 LAB — SARS CORONAVIRUS 2 (TAT 6-24 HRS): SARS Coronavirus 2: NEGATIVE

## 2020-12-10 ENCOUNTER — Ambulatory Visit (HOSPITAL_COMMUNITY): Payer: PPO | Admitting: Anesthesiology

## 2020-12-10 ENCOUNTER — Encounter (HOSPITAL_COMMUNITY): Payer: Self-pay

## 2020-12-10 ENCOUNTER — Ambulatory Visit (HOSPITAL_COMMUNITY)
Admission: RE | Admit: 2020-12-10 | Discharge: 2020-12-10 | Disposition: A | Discharge status: Home / Self Care | Payer: PPO | Attending: Internal Medicine | Admitting: Internal Medicine

## 2020-12-10 ENCOUNTER — Encounter (HOSPITAL_COMMUNITY)
Admission: RE | Disposition: A | Discharge status: Home / Self Care | Payer: Self-pay | Source: Home / Self Care | Attending: Internal Medicine

## 2020-12-10 DIAGNOSIS — K648 Other hemorrhoids: Secondary | ICD-10-CM | POA: Diagnosis not present

## 2020-12-10 DIAGNOSIS — Z1211 Encounter for screening for malignant neoplasm of colon: Secondary | ICD-10-CM | POA: Diagnosis not present

## 2020-12-10 DIAGNOSIS — Z7984 Long term (current) use of oral hypoglycemic drugs: Secondary | ICD-10-CM | POA: Diagnosis not present

## 2020-12-10 DIAGNOSIS — Z79899 Other long term (current) drug therapy: Secondary | ICD-10-CM | POA: Insufficient documentation

## 2020-12-10 DIAGNOSIS — D123 Benign neoplasm of transverse colon: Secondary | ICD-10-CM

## 2020-12-10 DIAGNOSIS — D122 Benign neoplasm of ascending colon: Secondary | ICD-10-CM

## 2020-12-10 DIAGNOSIS — D120 Benign neoplasm of cecum: Secondary | ICD-10-CM | POA: Insufficient documentation

## 2020-12-10 DIAGNOSIS — Z791 Long term (current) use of non-steroidal anti-inflammatories (NSAID): Secondary | ICD-10-CM | POA: Insufficient documentation

## 2020-12-10 DIAGNOSIS — K635 Polyp of colon: Secondary | ICD-10-CM | POA: Diagnosis not present

## 2020-12-10 DIAGNOSIS — K5730 Diverticulosis of large intestine without perforation or abscess without bleeding: Secondary | ICD-10-CM | POA: Insufficient documentation

## 2020-12-10 HISTORY — PX: COLONOSCOPY WITH PROPOFOL: SHX5780

## 2020-12-10 HISTORY — PX: POLYPECTOMY: SHX5525

## 2020-12-10 HISTORY — PX: SCLEROTHERAPY: SHX6841

## 2020-12-10 HISTORY — PX: BIOPSY: SHX5522

## 2020-12-10 LAB — GLUCOSE, CAPILLARY: Glucose-Capillary: 138 mg/dL — ABNORMAL HIGH (ref 70–99)

## 2020-12-10 SURGERY — COLONOSCOPY WITH PROPOFOL
Anesthesia: General

## 2020-12-10 MED ORDER — PROPOFOL 10 MG/ML IV BOLUS
INTRAVENOUS | Status: DC | PRN
  Administered 2020-12-10: 30 mg via INTRAVENOUS
  Administered 2020-12-10: 50 mg via INTRAVENOUS
  Administered 2020-12-10: 30 mg via INTRAVENOUS
  Administered 2020-12-10: 10 mg via INTRAVENOUS
  Administered 2020-12-10: 20 mg via INTRAVENOUS
  Administered 2020-12-10: 30 mg via INTRAVENOUS
  Administered 2020-12-10: 20 mg via INTRAVENOUS
  Administered 2020-12-10: 50 mg via INTRAVENOUS
  Administered 2020-12-10: 70 mg via INTRAVENOUS
  Administered 2020-12-10 (×2): 20 mg via INTRAVENOUS

## 2020-12-10 MED ORDER — PROPOFOL 10 MG/ML IV BOLUS
INTRAVENOUS | Status: AC
  Filled 2020-12-10: qty 60

## 2020-12-10 MED ORDER — CHLORHEXIDINE GLUCONATE CLOTH 2 % EX PADS: 6.0000 | MEDICATED_PAD | Freq: Once | CUTANEOUS | Status: DC

## 2020-12-10 MED ORDER — SPOT INK MARKER SYRINGE KIT
PACK | SUBMUCOSAL | Status: AC
  Filled 2020-12-10: qty 5

## 2020-12-10 MED ORDER — SPOT INK MARKER SYRINGE KIT
PACK | SUBMUCOSAL | Status: DC | PRN
  Administered 2020-12-10: 3 mL via SUBMUCOSAL

## 2020-12-10 MED ORDER — STERILE WATER FOR IRRIGATION IR SOLN
Status: DC | PRN
  Administered 2020-12-10: 08:00:00 100 mL

## 2020-12-10 MED ORDER — PROPOFOL 500 MG/50ML IV EMUL
INTRAVENOUS | Status: DC | PRN
  Administered 2020-12-10: 150 ug/kg/min via INTRAVENOUS

## 2020-12-10 MED ORDER — LACTATED RINGERS IV SOLN: INTRAVENOUS | Status: DC

## 2020-12-10 NOTE — Discharge Instructions (Signed)
  Colonoscopy Discharge Instructions  Read the instructions outlined below and refer to this sheet in the next few weeks. These discharge instructions provide you with general information on caring for yourself after you leave the hospital. Your doctor may also give you specific instructions. While your treatment has been planned according to the most current medical practices available, unavoidable complications occasionally occur.   ACTIVITY  You may resume your regular activity, but move at a slower pace for the next 24 hours.   Take frequent rest periods for the next 24 hours.   Walking will help get rid of the air and reduce the bloated feeling in your belly (abdomen).   No driving for 24 hours (because of the medicine (anesthesia) used during the test).    Do not sign any important legal documents or operate any machinery for 24 hours (because of the anesthesia used during the test).  NUTRITION  Drink plenty of fluids.   You may resume your normal diet as instructed by your doctor.   Begin with a light meal and progress to your normal diet. Heavy or fried foods are harder to digest and may make you feel sick to your stomach (nauseated).   Avoid alcoholic beverages for 24 hours or as instructed.  MEDICATIONS  You may resume your normal medications unless your doctor tells you otherwise.  WHAT YOU CAN EXPECT TODAY  Some feelings of bloating in the abdomen.   Passage of more gas than usual.   Spotting of blood in your stool or on the toilet paper.  IF YOU HAD POLYPS REMOVED DURING THE COLONOSCOPY:  No aspirin products for 7 days or as instructed.   No alcohol for 7 days or as instructed.   Eat a soft diet for the next 24 hours.  FINDING OUT THE RESULTS OF YOUR TEST Not all test results are available during your visit. If your test results are not back during the visit, make an appointment with your caregiver to find out the results. Do not assume everything is normal if  you have not heard from your caregiver or the medical facility. It is important for you to follow up on all of your test results.  SEEK IMMEDIATE MEDICAL ATTENTION IF:  You have more than a spotting of blood in your stool.   Your belly is swollen (abdominal distention).   You are nauseated or vomiting.   You have a temperature over 101.   You have abdominal pain or discomfort that is severe or gets worse throughout the day.   Your colonoscopy revealed 5 polyps, 1 of which was rather large which I had to perform a resection on.  Await pathology results.  I will contact you later this week on what we need to do next.  This may include a colonoscopy in the next 3 months versus referral [place].  I hope you have a great rest of your week!  Hennie Duos. Marletta Lor, D.O. Gastroenterology and Hepatology [facility]

## 2020-12-10 NOTE — Transfer of Care (Signed)
Immediate Anesthesia Transfer of Care Note  Patient: Christian Woodward  Procedure(s) Performed: COLONOSCOPY WITH PROPOFOL (N/A ) BIOPSY POLYPECTOMY SCLEROTHERAPY  Patient Location: PACU  Anesthesia Type:General  Level of Consciousness: awake  Airway & Oxygen Therapy: Patient Spontanous Breathing  Post-op Assessment: Report given to RN  Post vital signs: Reviewed and stable  Last Vitals:  Vitals Value Taken Time  BP    Temp    Pulse    Resp 10 12/10/20 0836  SpO2    Vitals shown include unvalidated device data.  Last Pain:  Vitals:   12/10/20 0730  TempSrc:   PainSc: 0-No pain         Complications: No complications documented.

## 2020-12-10 NOTE — Anesthesia Postprocedure Evaluation (Signed)
Anesthesia Post Note  Patient: Christian Woodward  Procedure(s) Performed: COLONOSCOPY WITH PROPOFOL (N/A ) BIOPSY POLYPECTOMY SCLEROTHERAPY  Patient location during evaluation: PACU Anesthesia Type: General Level of consciousness: awake and alert and oriented Pain management: pain level controlled Vital Signs Assessment: post-procedure vital signs reviewed and stable Respiratory status: spontaneous breathing Cardiovascular status: blood pressure returned to baseline and stable Postop Assessment: no apparent nausea or vomiting Anesthetic complications: no   No complications documented.   Last Vitals:  Vitals:   12/10/20 0645  BP: (!) 124/99  Pulse: 66  Resp: 18  Temp: 36.9 C  SpO2: 97%    Last Pain:  Vitals:   12/10/20 0730  TempSrc:   PainSc: 0-No pain                 Sanchez Hemmer

## 2020-12-10 NOTE — H&P (Signed)
Primary Care Physician:  Gareth Morgan, MD Primary Gastroenterologist:  Dr. Marletta Lor  Pre-Procedure History & Physical: HPI:  Christian Woodward is a 69 y.o. male is here for a colonoscopy for colon cancer screening purposes. Patient states his last colonoscopy was in 2010 by Dr. Lovell Sheehan which was unremarkable.  Patient is adopted, so colorectal family history is unknown.  No melena or hematochezia.  No abdominal pain.  No unintentional weight loss.  Does admit to occasional reflux which appears to be diet related.  Every now and then he will have to take Rolaids for heartburn.  Denies any dysphagia or odynophagia.  No regurgitation of partially foods.  Otherwise he has no other complaints.  Past Medical History:  Diagnosis Date  . Diabetes ([facility])   . HTN (hypertension)     Past Surgical History:  Procedure Laterality Date  . COLONOSCOPY    . HERNIA REPAIR      Prior to Admission medications   Medication Sig Start Date End Date Taking? Authorizing Provider  diltiazem (CARDIZEM) 120 MG tablet Take 120 mg by mouth 2 (two) times daily. 06/13/20  Yes [provider]  glipiZIDE (GLUCOTROL XL) 10 MG 24 hr tablet Take 10 mg by mouth daily. 08/22/20  Yes [provider]  ibuprofen (ADVIL) 200 MG tablet Take 400 mg by mouth every 6 (six) hours as needed for headache or moderate pain.   Yes [provider]  lisinopril (ZESTRIL) 20 MG tablet Take 20 mg by mouth daily. 07/07/20  Yes [provider]  metFORMIN (GLUCOPHAGE-XR) 500 MG 24 hr tablet Take 1,000 mg by mouth 2 (two) times daily. 06/13/20  Yes [provider]  pioglitazone (ACTOS) 45 MG tablet Take 45 mg by mouth every morning. 08/01/20  Yes [provider]  pravastatin (PRAVACHOL) 40 MG tablet Take 40 mg by mouth daily. 06/13/20  Yes [provider]  traZODone (DESYREL) 100 MG tablet Take 100 mg by mouth at bedtime.   Yes [provider]  tadalafil (CIALIS) 20 MG tablet Take 20 mg  by mouth daily as needed for erectile dysfunction. 11/09/20   [provider]    Allergies as of 09/05/2020  . (No Known Allergies)    Family History  Adopted: Yes    Social History   Socioeconomic History  . Marital status: Married    Spouse name: Not on file  . Number of children: Not on file  . Years of education: Not on file  . Highest education level: Not on file  Occupational History  . Not on file  Tobacco Use  . Smoking status: Never Smoker  . Smokeless tobacco: Never Used  Substance and Sexual Activity  . Alcohol use: Never  . Drug use: Never  . Sexual activity: Not on file  Other Topics Concern  . Not on file  Social History Narrative  . Not on file   Social Determinants of Health   Financial Resource Strain: Not on file  Food Insecurity: Not on file  Transportation Needs: Not on file  Physical Activity: Not on file  Stress: Not on file  Social Connections: Not on file  Intimate Partner Violence: Not on file    Review of Systems: See HPI, otherwise negative ROS  Impression/Plan: KAMDIN FOLLETT is here for a colonoscopy to be performed for colon cancer screening purposes.  The risks of the procedure including infection, bleed, or perforation as well as benefits, limitations, alternatives and imponderables have been reviewed with the patient. Questions  have been answered. All parties agreeable.

## 2020-12-10 NOTE — Anesthesia Preprocedure Evaluation (Signed)
Anesthesia Evaluation  Patient identified by MRN, date of birth, ID band Patient awake    Reviewed: Allergy & Precautions, H&P , NPO status , Patient's Chart, lab work & pertinent test results, reviewed documented beta blocker date and time   Airway Mallampati: II  TM Distance: >3 FB Neck ROM: full    Dental no notable dental hx.    Pulmonary neg pulmonary ROS,    Pulmonary exam normal breath sounds clear to auscultation       Cardiovascular Exercise Tolerance: Good hypertension, On Medications negative cardio ROS   Rhythm:regular Rate:Normal     Neuro/Psych negative neurological ROS  negative psych ROS   GI/Hepatic Neg liver ROS, GERD  Medicated,  Endo/Other  negative endocrine ROSdiabetes, Type 2  Renal/GU negative Renal ROS  negative genitourinary   Musculoskeletal   Abdominal   Peds  Hematology negative hematology ROS (+)   Anesthesia Other Findings   Reproductive/Obstetrics negative OB ROS                             Anesthesia Physical Anesthesia Plan  ASA: II  Anesthesia Plan: General   Post-op Pain Management:    Induction:   PONV Risk Score and Plan: Propofol infusion  Airway Management Planned:   Additional Equipment:   Intra-op Plan:   Post-operative Plan:   Informed Consent: I have reviewed the patients History and Physical, chart, labs and discussed the procedure including the risks, benefits and alternatives for the proposed anesthesia with the patient or authorized representative who has indicated his/her understanding and acceptance.     Dental Advisory Given  Plan Discussed with: CRNA  Anesthesia Plan Comments:         Anesthesia Quick Evaluation

## 2020-12-10 NOTE — Op Note (Signed)
[facility] Patient Name: Christian Woodward Procedure Date: 12/10/2020 7:16 AM MRN: [id number] Date of Birth: 20-Jun-1951 Attending MD: Elon Alas. Abbey Chatters DO CSN: [id number] Age: 69 Admit Type: Outpatient Procedure:                Colonoscopy Indications:              Screening for colorectal malignant neoplasm Providers:                Elon Alas. Abbey Chatters, DO, [place], RN, Wynonia Musty Tech, Technician, [employer],                            Merchant navy officer, [employer]. Risa Grill, Technician Referring MD:              Medicines:                See the Anesthesia note for documentation of the                            administered medications Complications:            No immediate complications. Estimated Blood Loss:     Estimated blood loss was minimal. Procedure:                Pre-Anesthesia Assessment:                           - The anesthesia plan was to use monitored                            anesthesia care (MAC).                           After obtaining informed consent, the colonoscope                            was passed under direct vision. Throughout the                            procedure, the patient's blood pressure, pulse, and                            oxygen saturations were monitored continuously. The                            PCF-H190DL ([id number]) scope was introduced through                            the anus and advanced to the the cecum, identified                            by appendiceal orifice and ileocecal valve. The                            colonoscopy was performed  without difficulty. The                            patient tolerated the procedure well. The quality                            of the bowel preparation was evaluated using the                            BBPS [facility] Bowel Preparation Scale) with scores                            of: Right Colon = 2 (minor amount of residual                             staining, small fragments of stool and/or opaque                            liquid, but mucosa seen well), Transverse Colon = 2                            (minor amount of residual staining, small fragments                            of stool and/or opaque liquid, but mucosa seen                            well) and Left Colon = 2 (minor amount of residual                            staining, small fragments of stool and/or opaque                            liquid, but mucosa seen well). The total BBPS score                            equals 6. The quality of the bowel preparation was                            fair. Scope In: 7:34:53 AM Scope Out: 8:30:45 AM Scope Withdrawal Time: 0 hours 53 minutes 35 seconds  Total Procedure Duration: 0 hours 55 minutes 52 seconds  Findings:      The perianal and digital rectal examinations were normal.      Non-bleeding internal hemorrhoids were found during endoscopy.      Multiple small-mouthed diverticula were found in the sigmoid colon,       descending colon and transverse colon.      An area of nodular mucosa was found at the ileocecal valve. Biopsies       were taken with a cold forceps for histology.      A 7 mm polyp was found in the ascending colon. The polyp was sessile.       The polyp was removed with a cold snare. Resection and retrieval were  complete.      A 18 mm polyp was found in the proximal transverse colon. The polyp was       flat. Preparations were made for mucosal resection. Eleview was injected       with adequate lift of the lesion from the muscularis propria. Margins       were well demarcated. Snare mucosal resection with snare retrieval was       performed in a piecemeal fashion. Resection was incomplete. The resected       tissue was retrieved. Edges were burned with snare tip coagulation There       was no bleeding during the procedure. Area distal was tattooed with an       injection of 5 mL of Spot (carbon  black) in a 3 quadrant fashion.      Two sessile polyps were found in the transverse colon. The polyps were 6       to 8 mm in size. These polyps were removed with a cold snare. Resection       and retrieval were complete. Impression:               - Preparation of the colon was fair.                           - Non-bleeding internal hemorrhoids.                           - Diverticulosis in the sigmoid colon, in the                            descending colon and in the transverse colon.                           - Nodular mucosa at the ileocecal valve. Biopsied.                           - One 7 mm polyp in the ascending colon, removed                            with a cold snare. Resected and retrieved.                           - One 18 mm polyp in the proximal transverse colon,                            removed with mucosal resection. Incomplete                            resection. Resected tissue retrieved. Tattooed.                           - Two 6 to 8 mm polyps in the transverse colon,                            removed with a cold snare. Resected and retrieved.                           -  Mucosal resection was performed. Resection was                            incomplete. The resected tissue was retrieved. Moderate Sedation:      Per Anesthesia Care Recommendation:           - Patient has a contact number available for                            emergencies. The signs and symptoms of potential                            delayed complications were discussed with the                            patient. Return to normal activities tomorrow.                            Written discharge instructions were provided to the                            patient.                           - Resume previous diet.                           - Continue present medications.                           - Await pathology results.                           - Repeat colonoscopy in 3 months for  surveillance                            after piecemeal polypectomy.                           - If IC valve pathology shows adenomatous tissue,                            patient may need R hemicolectomy vs referral to                            advanced endo for EMR.                           - Return to GI clinic after studies are complete. Procedure Code(s):        --- Professional ---                           [phone], Colonoscopy, flexible; with endoscopic                            mucosal resection  26333, 35, Colonoscopy, flexible; with removal of                            tumor(s), polyp(s), or other lesion(s) by snare                            technique                           45380, 59, Colonoscopy, flexible; with biopsy,                            single or multiple Diagnosis Code(s):        --- Professional ---                           K63.5, Polyp of colon                           Z12.11, Encounter for screening for malignant                            neoplasm of colon                           K64.8, Other hemorrhoids                           K63.89, Other specified diseases of intestine                           K57.30, Diverticulosis of large intestine without                            perforation or abscess without bleeding CPT copyright 2019 American Medical Association. All rights reserved. The codes documented in this report are preliminary and upon coder review may  be revised to meet current compliance requirements. Elon Alas. Abbey Chatters, DO [place] Abbey Chatters, DO 12/10/2020 8:43:27 AM This report has been signed electronically. Number of Addenda: 0

## 2020-12-11 LAB — SURGICAL PATHOLOGY

## 2020-12-16 ENCOUNTER — Encounter (HOSPITAL_COMMUNITY): Payer: Self-pay | Admitting: Internal Medicine

## 2020-12-20 ENCOUNTER — Other Ambulatory Visit: Payer: Self-pay

## 2020-12-20 DIAGNOSIS — D126 Benign neoplasm of colon, unspecified: Secondary | ICD-10-CM

## 2021-01-07 ENCOUNTER — Ambulatory Visit: Payer: PPO | Admitting: General Surgery

## 2021-01-07 ENCOUNTER — Encounter: Payer: Self-pay | Admitting: General Surgery

## 2021-01-07 ENCOUNTER — Other Ambulatory Visit: Payer: Self-pay

## 2021-01-07 VITALS — BP 99/62 | HR 90 | Temp 97.2°F | Resp 16 | Ht 70.0 in | Wt 239.0 lb

## 2021-01-07 DIAGNOSIS — D122 Benign neoplasm of ascending colon: Secondary | ICD-10-CM

## 2021-01-07 MED ORDER — PEG-3350/ELECTROLYTES 236 G PO SOLR: 4000.0000 mL | Freq: Once | ORAL | 0 refills | Status: AC

## 2021-01-07 MED ORDER — METRONIDAZOLE 500 MG PO TABS: 500.0000 mg | ORAL_TABLET | Freq: Three times a day (TID) | ORAL | 0 refills | Status: DC

## 2021-01-07 MED ORDER — NEOMYCIN SULFATE 500 MG PO TABS: 1000.0000 mg | ORAL_TABLET | Freq: Three times a day (TID) | ORAL | 0 refills | Status: DC

## 2021-01-07 NOTE — Patient Instructions (Signed)
Colon Polyps  Colon polyps are tissue growths inside the colon, which is part of the large intestine. They are one of the types of polyps that can grow in the body. A polyp may be a round bump or a mushroom-shaped growth. You could have one polyp or more than one. Most colon polyps are noncancerous (benign). However, some colon polyps can become cancerous over time. Finding and removing the polyps early can help prevent this. What are the causes? The exact cause of colon polyps is not known. What increases the risk? The following factors may make you more likely to develop this condition:  Having a family history of colorectal cancer or colon polyps.  Being older than 70 years of age.  Being younger than 69 years of age and having a significant family history of colorectal cancer or colon polyps or a genetic condition that puts you at higher risk of getting colon polyps.  Having inflammatory bowel disease, such as ulcerative colitis or Crohn's disease.  Having certain conditions passed from parent to child (hereditary conditions), such as: ? Familial adenomatous polyposis (FAP). ? Lynch syndrome. ? Turcot syndrome. ? Peutz-Jeghers syndrome. ? MUTYH-associated polyposis (MAP).  Being overweight.  Certain lifestyle factors. These include smoking cigarettes, drinking too much alcohol, not getting enough exercise, and eating a diet that is high in fat and red meat and low in fiber.  Having had childhood cancer that was treated with radiation of the abdomen. What are the signs or symptoms? Many times, there are no symptoms. If you have symptoms, they may include:  Blood coming from the rectum during a bowel movement.  Blood in the stool (feces). The blood may be bright red or very dark in color.  Pain in the abdomen.  A change in bowel habits, such as constipation or diarrhea. How is this diagnosed? This condition is diagnosed with a colonoscopy. This is a procedure in which a  lighted, flexible scope is inserted into the opening between the buttocks (anus) and then passed into the colon to examine the area. Polyps are sometimes found when a colonoscopy is done as part of routine cancer screening tests. How is this treated? This condition is treated by removing any polyps that are found. Most polyps can be removed during a colonoscopy. Those polyps will then be tested for cancer. Additional treatment may be needed depending on the results of testing. Follow these instructions at home: Eating and drinking  Eat foods that are high in fiber, such as fruits, vegetables, and whole grains.  Eat foods that are high in calcium and vitamin D, such as milk, cheese, yogurt, eggs, liver, fish, and broccoli.  Limit foods that are high in fat, such as fried foods and desserts.  Limit the amount of red meat, precooked or cured meat, or other processed meat that you eat, such as hot dogs, sausages, bacon, or meat loaves.  Limit sugary drinks.   Lifestyle  Maintain a healthy weight, or lose weight if recommended by your health care provider.  Exercise every day or as told by your health care provider.  Do not use any products that contain nicotine or tobacco, such as cigarettes, e-cigarettes, and chewing tobacco. If you need help quitting, ask your health care provider.  Do not drink alcohol if: ? Your health care provider tells you not to drink. ? You are pregnant, may be pregnant, or are planning to become pregnant.  If you drink alcohol: ? Limit how much you use to:  0-1 drink a day for women.  0-2 drinks a day for men. ? Know how much alcohol is in your drink. In the [place] one drink equals one 12 oz bottle of beer (355 mL), one 5 oz glass of wine (148 mL), or one 1 oz glass of hard liquor (44 mL). General instructions  Take over-the-counter and prescription medicines only as told by your health care provider.  Keep all follow-up visits. This is important. This  includes having regularly scheduled colonoscopies. Talk to your health care provider about when you need a colonoscopy. Contact a health care provider if:  You have new or worsening bleeding during a bowel movement.  You have new or increased blood in your stool.  You have a change in bowel habits.  You lose weight for no known reason. Summary  Colon polyps are tissue growths inside the colon, which is part of the large intestine. They are one type of polyp that can grow in the body.  Most colon polyps are noncancerous (benign), but some can become cancerous over time.  This condition is diagnosed with a colonoscopy.  This condition is treated by removing any polyps that are found. Most polyps can be removed during a colonoscopy. This information is not intended to replace advice given to you by your health care provider. Make sure you discuss any questions you have with your health care provider. Document Revised: 03/20/2020 Document Reviewed: 03/20/2020 Elsevier Patient Education  2021 Elsevier Inc.  

## 2021-01-08 NOTE — H&P (Signed)
Christian Woodward; [id number]; Jun 17, 1951   HPI Patient is a 70 year old white male who was referred to my care by Dr. Abbey Chatters for evaluation and treatment of an adenomatous polyp of the cecum.  Patient underwent a screening colonoscopy and was found to have multiple polyps excised.  None were dysplastic, but he was unable to fully excise the polypoid lesion in the cecum.  He was offered follow-up colonoscopy excision versus partial colectomy.  Patient has done well since the procedure.  He denies any family history of colon cancer. Past Medical History:  Diagnosis Date  . Diabetes ([place])   . HTN (hypertension)     Past Surgical History:  Procedure Laterality Date  . BIOPSY  12/10/2020   Procedure: BIOPSY;  Surgeon: Eloise Harman, DO;  [facility];  Service: Endoscopy;;  . COLONOSCOPY    . COLONOSCOPY WITH PROPOFOL N/A 12/10/2020   Procedure: COLONOSCOPY WITH PROPOFOL;  Surgeon: Eloise Harman, DO;  [facility];  Service: Endoscopy;  Laterality: N/A;  10:00am  . HERNIA REPAIR    . POLYPECTOMY  12/10/2020   Procedure: POLYPECTOMY;  Surgeon: Eloise Harman, DO;  [facility];  Service: Endoscopy;;  . SCLEROTHERAPY  12/10/2020   Procedure: Clide Deutscher;  Surgeon: Eloise Harman, DO;  [facility];  Service: Endoscopy;;    Family History  Adopted: Yes    Current Outpatient Medications on File Prior to Visit  Medication Sig Dispense Refill  . diltiazem (CARDIZEM) 120 MG tablet Take 120 mg by mouth 2 (two) times daily.    Marland Kitchen glipiZIDE (GLUCOTROL XL) 10 MG 24 hr tablet Take 10 mg by mouth daily.    Marland Kitchen ibuprofen (ADVIL) 200 MG tablet Take 400 mg by mouth every 6 (six) hours as needed for headache or moderate pain.    Marland Kitchen lisinopril (ZESTRIL) 20 MG tablet Take 20 mg by mouth daily.    . metFORMIN (GLUCOPHAGE-XR) 500 MG 24 hr tablet Take 1,000 mg by mouth 2 (two) times daily.    . pioglitazone (ACTOS) 45 MG tablet Take 45 mg by mouth every  morning.    . pravastatin (PRAVACHOL) 40 MG tablet Take 40 mg by mouth daily.    . tadalafil (CIALIS) 20 MG tablet Take 20 mg by mouth daily as needed for erectile dysfunction.    . traZODone (DESYREL) 100 MG tablet Take 100 mg by mouth at bedtime.     No current facility-administered medications on file prior to visit.    No Known Allergies  Social History   Substance and Sexual Activity  Alcohol Use Never    Social History   Tobacco Use  Smoking Status Never Smoker  Smokeless Tobacco Never Used    Review of Systems  Constitutional: Negative.   HENT: Negative.   Eyes: Negative.   Respiratory: Negative.   Cardiovascular: Negative.   Gastrointestinal: Negative.   Genitourinary: Negative.   Musculoskeletal: Positive for joint pain.  Skin: Negative.   Neurological: Negative.   Endo/Heme/Allergies: Negative.   Psychiatric/Behavioral: Negative.     Objective   Vitals:   01/07/21 1457  BP: 99/62  Pulse: 90  Resp: 16  Temp: (!) 97.2 F (36.2 C)  SpO2: 95%    Physical Exam Vitals reviewed.  Constitutional:      Appearance: Normal appearance. He is not ill-appearing.  HENT:     Head: Normocephalic and atraumatic.  Cardiovascular:     Rate and Rhythm: Normal rate and regular rhythm.  Heart sounds: Normal heart sounds. No murmur heard. No friction rub. No gallop.   Pulmonary:     Effort: Pulmonary effort is normal. No respiratory distress.     Breath sounds: Normal breath sounds. No stridor. No wheezing, rhonchi or rales.  Chest:     Chest wall: No tenderness.  Abdominal:     General: Bowel sounds are normal. There is no distension.     Palpations: Abdomen is soft. There is no mass.     Tenderness: There is no abdominal tenderness. There is no guarding or rebound.     Hernia: No hernia is present.  Skin:    General: Skin is warm and dry.  Neurological:     Mental Status: He is alert and oriented to person, place, and time.    Colonoscopy and  pathology reports reviewed Assessment  Adenomatous polyp, cecum Plan   Patient is scheduled for a partial colectomy on 01/27/2021.  The risks and benefits of the procedure including bleeding, infection, anastomotic leak, cardiopulmonary difficulties, and the possibility of malignancy were fully explained to the patient, who gave informed consent.  GoLytely, neomycin, and Flagyl have been ordered preoperatively.

## 2021-01-08 NOTE — Progress Notes (Signed)
Christian Woodward; [id number]; Jun 17, 1951   HPI Patient is a 69 year old white male who was referred to my care by Dr. Abbey Chatters for evaluation and treatment of an adenomatous polyp of the cecum.  Patient underwent a screening colonoscopy and was found to have multiple polyps excised.  None were dysplastic, but he was unable to fully excise the polypoid lesion in the cecum.  He was offered follow-up colonoscopy excision versus partial colectomy.  Patient has done well since the procedure.  He denies any family history of colon cancer. Past Medical History:  Diagnosis Date  . Diabetes ([place])   . HTN (hypertension)     Past Surgical History:  Procedure Laterality Date  . BIOPSY  12/10/2020   Procedure: BIOPSY;  Surgeon: Eloise Harman, DO;  [facility];  Service: Endoscopy;;  . COLONOSCOPY    . COLONOSCOPY WITH PROPOFOL N/A 12/10/2020   Procedure: COLONOSCOPY WITH PROPOFOL;  Surgeon: Eloise Harman, DO;  [facility];  Service: Endoscopy;  Laterality: N/A;  10:00am  . HERNIA REPAIR    . POLYPECTOMY  12/10/2020   Procedure: POLYPECTOMY;  Surgeon: Eloise Harman, DO;  [facility];  Service: Endoscopy;;  . SCLEROTHERAPY  12/10/2020   Procedure: Clide Deutscher;  Surgeon: Eloise Harman, DO;  [facility];  Service: Endoscopy;;    Family History  Adopted: Yes    Current Outpatient Medications on File Prior to Visit  Medication Sig Dispense Refill  . diltiazem (CARDIZEM) 120 MG tablet Take 120 mg by mouth 2 (two) times daily.    Marland Kitchen glipiZIDE (GLUCOTROL XL) 10 MG 24 hr tablet Take 10 mg by mouth daily.    Marland Kitchen ibuprofen (ADVIL) 200 MG tablet Take 400 mg by mouth every 6 (six) hours as needed for headache or moderate pain.    Marland Kitchen lisinopril (ZESTRIL) 20 MG tablet Take 20 mg by mouth daily.    . metFORMIN (GLUCOPHAGE-XR) 500 MG 24 hr tablet Take 1,000 mg by mouth 2 (two) times daily.    . pioglitazone (ACTOS) 45 MG tablet Take 45 mg by mouth every  morning.    . pravastatin (PRAVACHOL) 40 MG tablet Take 40 mg by mouth daily.    . tadalafil (CIALIS) 20 MG tablet Take 20 mg by mouth daily as needed for erectile dysfunction.    . traZODone (DESYREL) 100 MG tablet Take 100 mg by mouth at bedtime.     No current facility-administered medications on file prior to visit.    No Known Allergies  Social History   Substance and Sexual Activity  Alcohol Use Never    Social History   Tobacco Use  Smoking Status Never Smoker  Smokeless Tobacco Never Used    Review of Systems  Constitutional: Negative.   HENT: Negative.   Eyes: Negative.   Respiratory: Negative.   Cardiovascular: Negative.   Gastrointestinal: Negative.   Genitourinary: Negative.   Musculoskeletal: Positive for joint pain.  Skin: Negative.   Neurological: Negative.   Endo/Heme/Allergies: Negative.   Psychiatric/Behavioral: Negative.     Objective   Vitals:   01/07/21 1457  BP: 99/62  Pulse: 90  Resp: 16  Temp: (!) 97.2 F (36.2 C)  SpO2: 95%    Physical Exam Vitals reviewed.  Constitutional:      Appearance: Normal appearance. He is not ill-appearing.  HENT:     Head: Normocephalic and atraumatic.  Cardiovascular:     Rate and Rhythm: Normal rate and regular rhythm.  Heart sounds: Normal heart sounds. No murmur heard. No friction rub. No gallop.   Pulmonary:     Effort: Pulmonary effort is normal. No respiratory distress.     Breath sounds: Normal breath sounds. No stridor. No wheezing, rhonchi or rales.  Chest:     Chest wall: No tenderness.  Abdominal:     General: Bowel sounds are normal. There is no distension.     Palpations: Abdomen is soft. There is no mass.     Tenderness: There is no abdominal tenderness. There is no guarding or rebound.     Hernia: No hernia is present.  Skin:    General: Skin is warm and dry.  Neurological:     Mental Status: He is alert and oriented to person, place, and time.    Colonoscopy and  pathology reports reviewed Assessment  Adenomatous polyp, cecum Plan   Patient is scheduled for a partial colectomy on 01/27/2021.  The risks and benefits of the procedure including bleeding, infection, anastomotic leak, cardiopulmonary difficulties, and the possibility of malignancy were fully explained to the patient, who gave informed consent.  GoLytely, neomycin, and Flagyl have been ordered preoperatively. 

## 2021-01-21 NOTE — Patient Instructions (Addendum)
Your procedure is scheduled on: 01/27/2021  Report to [facility] at   6:15  AM.  Call this number if you have problems the morning of surgery: [phone]   Remember:   Do not Eat or Drink after midnight         No Smoking the morning of surgery  :  Take these medicines the morning of surgery with A SIP OF WATER: Diltiazem               Follow instructions from the office regarding Bowel prep and antibiotic medication   Do not wear jewelry, make-up or nail polish.  Do not wear lotions, powders, or perfumes. You may wear deodorant.  Do not shave 48 hours prior to surgery. Men may shave face and neck.  Do not bring valuables to the hospital.  Contacts, dentures or bridgework may not be worn into surgery.  Leave suitcase in the car. After surgery it may be brought to your room.  For patients admitted to the hospital, checkout time is 11:00 AM the day of discharge.   Patients discharged the day of surgery will not be allowed to drive home.    Special Instructions: Shower using CHG night before surgery and shower the day of surgery use CHG.  Use special wash - you have one bottle of CHG for all showers.  You should use approximately 1/2 of the bottle for each shower.  How to Use Chlorhexidine for Bathing Chlorhexidine gluconate (CHG) is a germ-killing (antiseptic) solution that is used to clean the skin. It can get rid of the bacteria that normally live on the skin and can keep them away for about 24 hours. To clean your skin with CHG, you may be given:  A CHG solution to use in the shower or as part of a sponge bath.  A prepackaged cloth that contains CHG. Cleaning your skin with CHG may help lower the risk for infection:  While you are staying in the intensive care unit of the hospital.  If you have a vascular access, such as a central line, to provide short-term or long-term access to your veins.  If you have a catheter to drain urine from your bladder.  If you are on a  ventilator. A ventilator is a machine that helps you breathe by moving air in and out of your lungs.  After surgery. What are the risks? Risks of using CHG include:  A skin reaction.  Hearing loss, if CHG gets in your ears.  Eye injury, if CHG gets in your eyes and is not rinsed out.  The CHG product catching fire. Make sure that you avoid smoking and flames after applying CHG to your skin. Do not use CHG:  If you have a chlorhexidine allergy or have previously reacted to chlorhexidine.  On babies younger than 32 months of age. How to use CHG solution  Use CHG only as told by your health care provider, and follow the instructions on the label.  Use the full amount of CHG as directed. Usually, this is one bottle. During a shower Follow these steps when using CHG solution during a shower (unless your health care provider gives you different instructions): 1. Start the shower. 2. Use your normal soap and shampoo to wash your face and hair. 3. Turn off the shower or move out of the shower stream. 4. Pour the CHG onto a clean washcloth. Do not use any type of brush or rough-edged sponge. 5. Starting at  your neck, lather your body down to your toes. Make sure you follow these instructions: ? If you will be having surgery, pay special attention to the part of your body where you will be having surgery. Scrub this area for at least 1 minute. ? Do not use CHG on your head or face. If the solution gets into your ears or eyes, rinse them well with water. ? Avoid your genital area. ? Avoid any areas of skin that have broken skin, cuts, or scrapes. ? Scrub your back and under your arms. Make sure to wash skin folds. 6. Let the lather sit on your skin for 1-2 minutes or as long as told by your health care provider. 7. Thoroughly rinse your entire body in the shower. Make sure that all body creases and crevices are rinsed well. 8. Dry off with a clean towel. Do not put any substances on your  body afterward--such as powder, lotion, or perfume--unless you are told to do so by your health care provider. Only use lotions that are recommended by the manufacturer. 9. Put on clean clothes or pajamas. 10. If it is the night before your surgery, sleep in clean sheets.   During a sponge bath Follow these steps when using CHG solution during a sponge bath (unless your health care provider gives you different instructions): 1. Use your normal soap and shampoo to wash your face and hair. 2. Pour the CHG onto a clean washcloth. 3. Starting at your neck, lather your body down to your toes. Make sure you follow these instructions: ? If you will be having surgery, pay special attention to the part of your body where you will be having surgery. Scrub this area for at least 1 minute. ? Do not use CHG on your head or face. If the solution gets into your ears or eyes, rinse them well with water. ? Avoid your genital area. ? Avoid any areas of skin that have broken skin, cuts, or scrapes. ? Scrub your back and under your arms. Make sure to wash skin folds. 4. Let the lather sit on your skin for 1-2 minutes or as long as told by your health care provider. 5. Using a different clean, wet washcloth, thoroughly rinse your entire body. Make sure that all body creases and crevices are rinsed well. 6. Dry off with a clean towel. Do not put any substances on your body afterward--such as powder, lotion, or perfume--unless you are told to do so by your health care provider. Only use lotions that are recommended by the manufacturer. 7. Put on clean clothes or pajamas. 8. If it is the night before your surgery, sleep in clean sheets. How to use CHG prepackaged cloths  Only use CHG cloths as told by your health care provider, and follow the instructions on the label.  Use the CHG cloth on clean, dry skin.  Do not use the CHG cloth on your head or face unless your health care provider tells you to.  When washing  with the CHG cloth: ? Avoid your genital area. ? Avoid any areas of skin that have broken skin, cuts, or scrapes. Before surgery Follow these steps when using a CHG cloth to clean before surgery (unless your health care provider gives you different instructions): 1. Using the CHG cloth, vigorously scrub the part of your body where you will be having surgery. Scrub using a back-and-forth motion for 3 minutes. The area on your body should be completely wet with CHG  when you are done scrubbing. 2. Do not rinse. Discard the cloth and let the area air-dry. Do not put any substances on the area afterward, such as powder, lotion, or perfume. 3. Put on clean clothes or pajamas. 4. If it is the night before your surgery, sleep in clean sheets.   For general bathing Follow these steps when using CHG cloths for general bathing (unless your health care provider gives you different instructions). 1. Use a separate CHG cloth for each area of your body. Make sure you wash between any folds of skin and between your fingers and toes. Wash your body in the following order, switching to a new cloth after each step: ? The front of your neck, shoulders, and chest. ? Both of your arms, under your arms, and your hands. ? Your stomach and groin area, avoiding the genitals. ? Your right leg and foot. ? Your left leg and foot. ? The back of your neck, your back, and your buttocks. 2. Do not rinse. Discard the cloth and let the area air-dry. Do not put any substances on your body afterward--such as powder, lotion, or perfume--unless you are told to do so by your health care provider. Only use lotions that are recommended by the manufacturer. 3. Put on clean clothes or pajamas. Contact a health care provider if:  Your skin gets irritated after scrubbing.  You have questions about using your solution or cloth. Get help right away if:  Your eyes become very red or swollen.  Your eyes itch badly.  Your skin itches  badly and is red or swollen.  Your hearing changes.  You have trouble seeing.  You have swelling or tingling in your mouth or throat.  You have trouble breathing.  You swallow any chlorhexidine. Summary  Chlorhexidine gluconate (CHG) is a germ-killing (antiseptic) solution that is used to clean the skin. Cleaning your skin with CHG may help to lower your risk for infection.  You may be given CHG to use for bathing. It may be in a bottle or in a prepackaged cloth to use on your skin. Carefully follow your health care provider's instructions and the instructions on the product label.  Do not use CHG if you have a chlorhexidine allergy.  Contact your health care provider if your skin gets irritated after scrubbing. This information is not intended to replace advice given to you by your health care provider. Make sure you discuss any questions you have with your health care provider. Document Revised: 05/17/2020 Document Reviewed: 05/17/2020 Elsevier Patient Education  2021 [place].  Colectomy, Care After This sheet gives you information about how to care for yourself after your procedure. Your health care provider may also give you more specific instructions. If you have problems or questions, contact your health care provider. What can I expect after the procedure? After your procedure, it is common to have the following:  Pain in your abdomen, especially in the incision areas. You will be given medicine to control the pain.  Tiredness. This is a normal part of the recovery process. Your energy level will return to normal over the next several weeks.  Changes in your bowel movements, especially having bowel movements more often. Talk with your health care provider about how to manage this. Follow these instructions at home: Medicines  Take over-the-counter and prescription medicines only as told by your health care provider.  If you were prescribed an antibiotic medicine,  take it as told by your health care  provider. Do not stop using the antibiotic even if you start to feel better.  Ask your health care provider if the medicine prescribed to you requires you to avoid driving or using machinery. Eating and drinking  Follow instructions from your health care provider about what you can eat after surgery. You may be asked to begin with a diet that is low in fiber.  Do not drink alcohol if: ? Your health care provider tells you not to drink. ? You are pregnant, may be pregnant, or are planning to become pregnant.  If you drink alcohol: ? Limit how much you use to:  0-1 drink a day for women.  0-2 drinks a day for men. ? Be aware of how much alcohol is in your drink. In the [place] one drink equals one 12 oz bottle of beer (355 mL), one 5 oz glass of wine (148 mL), or one 1 oz glass of hard liquor (44 mL). Incision care  Follow instructions from your health care provider about how to take care of your incisions. Make sure you: ? Wash your hands with soap and water for at least 20 seconds before and after applying medicine to the area, and before and after changing your bandage (dressing). If soap and water are not available, use hand sanitizer. ? Change your dressing as told by your health care provider. ? Leave stitches (sutures) or staples in place. These skin closures may need to stay in place for 2 weeks or longer. If adhesive strip edges start to loosen and curl up, you may trim the loose edges. Do not remove adhesive strips completely unless your health care provider tells you to do that.  Keep your incisions clean and dry.  Do not wear tight clothing over the incisions. Tight clothing may rub and irritate the incision areas, which may cause the incisions to open.  Do not take baths, swim, or use a hot tub until your health care provider approves. Ask your health care provider if you may take showers. You may only be allowed to take sponge  baths.  Check your incision area every day for signs of infection. Check for: ? More redness, swelling, or pain. ? Fluid or blood. ? Warmth. ? Pus or a bad smell.   Activity  Rest as told by your health care provider.  Avoid sitting for a long time without moving. Get up to take short walks every 1-2 hours. This is important to improve blood flow and breathing. Ask for help if you feel weak or unsteady.  Do not lift anything that is heavier than 10 lb (4.5 kg), or the limit that you are told, until your health care provider says that it is safe.  Return to your normal activities as told by your health care provider. Ask your health care provider what activities are safe for you. General instructions  Keep all follow-up visits as told by your health care provider. This is important. You may need a follow-up visit to remove sutures or staples. Contact a health care provider if:  You have more redness, swelling, or pain around your incisions.  You have fluid or blood coming from the incisions.  Your incisions feel warm to the touch.  You have pus or a bad smell coming from your incisions or your dressing.  You have a fever.  Your incisions break open after sutures or staples have been removed. Get help right away if:  You develop a rash.  You have chest  pain or difficulty breathing.  You have pain or swelling in your legs.  You feel light-headed or you faint.  Your abdomen swells (becomes distended).  You have nausea or vomiting.  You have blood in your stool. Summary  After the procedure, it is common to have pain in the abdomen, tiredness, or changes in bowel movements.  Follow instructions from your health care provider about how to care for your incisions.  Begin your diet with low-fiber foods. Your health care provider will tell you when to resume your regular diet.  Contact a health care provider if you have any signs of infection, such as a fever, more  redness, swelling, or pain around your incisions, or a bad smell coming from your incisions.  Get help right away if you have a rash, nausea, chest pain, pain or swelling in your legs, or blood in your stool. This information is not intended to replace advice given to you by your health care provider. Make sure you discuss any questions you have with your health care provider. Document Revised: 11/08/2019 Document Reviewed: 11/08/2019 Elsevier Patient Education  2021 [place] Anesthesia, Adult, Care After This sheet gives you information about how to care for yourself after your procedure. Your health care provider may also give you more specific instructions. If you have problems or questions, contact your health care provider. What can I expect after the procedure? After the procedure, the following side effects are common:  Pain or discomfort at the IV site.  Nausea.  Vomiting.  Sore throat.  Trouble concentrating.  Feeling cold or chills.  Feeling weak or tired.  Sleepiness and fatigue.  Soreness and body aches. These side effects can affect parts of the body that were not involved in surgery. Follow these instructions at home: For the time period you were told by your health care provider:  Rest.  Do not participate in activities where you could fall or become injured.  Do not drive or use machinery.  Do not drink alcohol.  Do not take sleeping pills or medicines that cause drowsiness.  Do not make important decisions or sign legal documents.  Do not take care of children on your own.   Eating and drinking  Follow any instructions from your health care provider about eating or drinking restrictions.  When you feel hungry, start by eating small amounts of foods that are soft and easy to digest (bland), such as toast. Gradually return to your regular diet.  Drink enough fluid to keep your urine pale yellow.  If you vomit, rehydrate by drinking  water, juice, or clear broth. General instructions  If you have sleep apnea, surgery and certain medicines can increase your risk for breathing problems. Follow instructions from your health care provider about wearing your sleep device: ? Anytime you are sleeping, including during daytime naps. ? While taking prescription pain medicines, sleeping medicines, or medicines that make you drowsy.  Have a responsible adult stay with you for the time you are told. It is important to have someone help care for you until you are awake and alert.  Return to your normal activities as told by your health care provider. Ask your health care provider what activities are safe for you.  Take over-the-counter and prescription medicines only as told by your health care provider.  If you smoke, do not smoke without supervision.  Keep all follow-up visits as told by your health care provider. This is important. Contact a health care  provider if:  You have nausea or vomiting that does not get better with medicine.  You cannot eat or drink without vomiting.  You have pain that does not get better with medicine.  You are unable to pass urine.  You develop a skin rash.  You have a fever.  You have redness around your IV site that gets worse. Get help right away if:  You have difficulty breathing.  You have chest pain.  You have blood in your urine or stool, or you vomit blood. Summary  After the procedure, it is common to have a sore throat or nausea. It is also common to feel tired.  Have a responsible adult stay with you for the time you are told. It is important to have someone help care for you until you are awake and alert.  When you feel hungry, start by eating small amounts of foods that are soft and easy to digest (bland), such as toast. Gradually return to your regular diet.  Drink enough fluid to keep your urine pale yellow.  Return to your normal activities as told by your health  care provider. Ask your health care provider what activities are safe for you. This information is not intended to replace advice given to you by your health care provider. Make sure you discuss any questions you have with your health care provider. Document Revised: 08/15/2020 Document Reviewed: 03/14/2020 Elsevier Patient Education  2021 [employer].

## 2021-01-24 ENCOUNTER — Other Ambulatory Visit (HOSPITAL_COMMUNITY)
Admission: RE | Admit: 2021-01-24 | Discharge: 2021-01-24 | Disposition: A | Discharge status: Home / Self Care | Payer: PPO | Source: Ambulatory Visit | Attending: General Surgery | Admitting: General Surgery

## 2021-01-24 ENCOUNTER — Other Ambulatory Visit: Payer: Self-pay

## 2021-01-24 ENCOUNTER — Ambulatory Visit (HOSPITAL_COMMUNITY)
Admission: RE | Admit: 2021-01-24 | Discharge: 2021-01-24 | Disposition: A | Discharge status: Home / Self Care | Payer: PPO | Source: Ambulatory Visit | Attending: General Surgery | Admitting: General Surgery

## 2021-01-24 ENCOUNTER — Encounter (HOSPITAL_COMMUNITY)
Admission: RE | Admit: 2021-01-24 | Discharge: 2021-01-24 | Disposition: A | Discharge status: Home / Self Care | Payer: PPO | Source: Ambulatory Visit | Attending: General Surgery | Admitting: General Surgery

## 2021-01-24 DIAGNOSIS — Z01818 Encounter for other preprocedural examination: Secondary | ICD-10-CM | POA: Insufficient documentation

## 2021-01-24 DIAGNOSIS — E119 Type 2 diabetes mellitus without complications: Secondary | ICD-10-CM | POA: Diagnosis present

## 2021-01-24 DIAGNOSIS — Z01812 Encounter for preprocedural laboratory examination: Secondary | ICD-10-CM | POA: Insufficient documentation

## 2021-01-24 DIAGNOSIS — Z79899 Other long term (current) drug therapy: Secondary | ICD-10-CM | POA: Diagnosis not present

## 2021-01-24 DIAGNOSIS — D120 Benign neoplasm of cecum: Secondary | ICD-10-CM | POA: Diagnosis present

## 2021-01-24 DIAGNOSIS — K635 Polyp of colon: Secondary | ICD-10-CM | POA: Insufficient documentation

## 2021-01-24 DIAGNOSIS — I10 Essential (primary) hypertension: Secondary | ICD-10-CM | POA: Diagnosis present

## 2021-01-24 DIAGNOSIS — Z7984 Long term (current) use of oral hypoglycemic drugs: Secondary | ICD-10-CM | POA: Diagnosis not present

## 2021-01-24 DIAGNOSIS — D123 Benign neoplasm of transverse colon: Secondary | ICD-10-CM | POA: Diagnosis present

## 2021-01-24 DIAGNOSIS — D122 Benign neoplasm of ascending colon: Secondary | ICD-10-CM | POA: Diagnosis present

## 2021-01-24 DIAGNOSIS — Z20822 Contact with and (suspected) exposure to covid-19: Secondary | ICD-10-CM | POA: Insufficient documentation

## 2021-01-24 LAB — CBC WITH DIFFERENTIAL/PLATELET
Abs Immature Granulocytes: 0.01 10*3/uL (ref 0.00–0.07)
Basophils Absolute: 0 10*3/uL (ref 0.0–0.1)
Basophils Relative: 0 %
Eosinophils Absolute: 0.1 10*3/uL (ref 0.0–0.5)
Eosinophils Relative: 1 %
HCT: 42.7 % (ref 39.0–52.0)
Hemoglobin: 14.7 g/dL (ref 13.0–17.0)
Immature Granulocytes: 0 %
Lymphocytes Relative: 36 %
Lymphs Abs: 1.6 10*3/uL (ref 0.7–4.0)
MCH: 33.3 pg (ref 26.0–34.0)
MCHC: 34.4 g/dL (ref 30.0–36.0)
MCV: 96.6 fL (ref 80.0–100.0)
Monocytes Absolute: 0.3 10*3/uL (ref 0.1–1.0)
Monocytes Relative: 7 %
Neutro Abs: 2.5 10*3/uL (ref 1.7–7.7)
Neutrophils Relative %: 56 %
Platelets: 154 10*3/uL (ref 150–400)
RBC: 4.42 MIL/uL (ref 4.22–5.81)
RDW: 13.9 % (ref 11.5–15.5)
WBC: 4.6 10*3/uL (ref 4.0–10.5)
nRBC: 0 % (ref 0.0–0.2)

## 2021-01-24 LAB — COMPREHENSIVE METABOLIC PANEL
ALT: 18 U/L (ref 0–44)
AST: 21 U/L (ref 15–41)
Albumin: 3.8 g/dL (ref 3.5–5.0)
Alkaline Phosphatase: 48 U/L (ref 38–126)
Anion gap: 10 (ref 5–15)
BUN: 23 mg/dL (ref 8–23)
CO2: 21 mmol/L — ABNORMAL LOW (ref 22–32)
Calcium: 9.2 mg/dL (ref 8.9–10.3)
Chloride: 104 mmol/L (ref 98–111)
Creatinine, Ser: 1.01 mg/dL (ref 0.61–1.24)
GFR, Estimated: >60 mL/min (ref >60)
Glucose, Bld: 213 mg/dL — ABNORMAL HIGH (ref 70–99)
Potassium: 3.9 mmol/L (ref 3.5–5.1)
Sodium: 135 mmol/L (ref 135–145)
Total Bilirubin: 0.6 mg/dL (ref 0.3–1.2)
Total Protein: 6.8 g/dL (ref 6.5–8.1)

## 2021-01-24 LAB — HEMOGLOBIN A1C
Hgb A1c MFr Bld: 7 % — ABNORMAL HIGH (ref 4.8–5.6)
Mean Plasma Glucose: 154.2 mg/dL

## 2021-01-24 LAB — TYPE AND SCREEN
ABO/RH(D): O POS
Antibody Screen: NEGATIVE

## 2021-01-24 LAB — SARS CORONAVIRUS 2 (TAT 6-24 HRS): SARS Coronavirus 2: NEGATIVE

## 2021-01-25 LAB — CEA: CEA: 2.1 ng/mL (ref 0.0–4.7)

## 2021-01-27 ENCOUNTER — Inpatient Hospital Stay (HOSPITAL_COMMUNITY)
Admission: RE | Admit: 2021-01-27 | Discharge: 2021-01-29 | DRG: 331 | Disposition: A | Discharge status: Home / Self Care | Payer: PPO | Attending: General Surgery | Admitting: General Surgery

## 2021-01-27 ENCOUNTER — Encounter (HOSPITAL_COMMUNITY): Payer: Self-pay | Admitting: General Surgery

## 2021-01-27 ENCOUNTER — Inpatient Hospital Stay (HOSPITAL_COMMUNITY): Payer: PPO | Admitting: Anesthesiology

## 2021-01-27 ENCOUNTER — Other Ambulatory Visit: Payer: Self-pay

## 2021-01-27 ENCOUNTER — Encounter (HOSPITAL_COMMUNITY)
Admission: RE | Disposition: A | Discharge status: Home / Self Care | Payer: Self-pay | Source: Home / Self Care | Attending: General Surgery

## 2021-01-27 DIAGNOSIS — Z79899 Other long term (current) drug therapy: Secondary | ICD-10-CM | POA: Diagnosis not present

## 2021-01-27 DIAGNOSIS — E119 Type 2 diabetes mellitus without complications: Secondary | ICD-10-CM | POA: Diagnosis not present

## 2021-01-27 DIAGNOSIS — D120 Benign neoplasm of cecum: Secondary | ICD-10-CM | POA: Diagnosis present

## 2021-01-27 DIAGNOSIS — D123 Benign neoplasm of transverse colon: Secondary | ICD-10-CM | POA: Diagnosis not present

## 2021-01-27 DIAGNOSIS — Z20822 Contact with and (suspected) exposure to covid-19: Secondary | ICD-10-CM | POA: Diagnosis not present

## 2021-01-27 DIAGNOSIS — Z9049 Acquired absence of other specified parts of digestive tract: Secondary | ICD-10-CM

## 2021-01-27 DIAGNOSIS — Z7984 Long term (current) use of oral hypoglycemic drugs: Secondary | ICD-10-CM

## 2021-01-27 DIAGNOSIS — I10 Essential (primary) hypertension: Secondary | ICD-10-CM | POA: Diagnosis present

## 2021-01-27 DIAGNOSIS — D122 Benign neoplasm of ascending colon: Secondary | ICD-10-CM | POA: Diagnosis not present

## 2021-01-27 HISTORY — PX: PARTIAL COLECTOMY: SHX5273

## 2021-01-27 LAB — GLUCOSE, CAPILLARY
Glucose-Capillary: 136 mg/dL — ABNORMAL HIGH (ref 70–99)
Glucose-Capillary: 185 mg/dL — ABNORMAL HIGH (ref 70–99)
Glucose-Capillary: 149 mg/dL — ABNORMAL HIGH (ref 70–99)
Glucose-Capillary: 169 mg/dL — ABNORMAL HIGH (ref 70–99)

## 2021-01-27 LAB — ABO/RH: ABO/RH(D): O POS

## 2021-01-27 SURGERY — COLECTOMY, PARTIAL
Anesthesia: General | Site: Abdomen

## 2021-01-27 MED ORDER — TRAZODONE HCL 50 MG PO TABS
100.0000 mg | ORAL_TABLET | Freq: Every day | ORAL | Status: DC
  Administered 2021-01-27: 100 mg via ORAL
  Filled 2021-01-27 (×2): qty 2

## 2021-01-27 MED ORDER — ENOXAPARIN SODIUM 40 MG/0.4ML ~~LOC~~ SOLN
40.0000 mg | SUBCUTANEOUS | Status: DC
  Administered 2021-01-28 – 2021-01-29 (×2): 40 mg via SUBCUTANEOUS
  Filled 2021-01-27 (×2): qty 0.4

## 2021-01-27 MED ORDER — ALVIMOPAN 12 MG PO CAPS
12.0000 mg | ORAL_CAPSULE | Freq: Two times a day (BID) | ORAL | Status: DC
  Administered 2021-01-28: 12 mg via ORAL
  Filled 2021-01-27: qty 1

## 2021-01-27 MED ORDER — SODIUM CHLORIDE 0.9 % IV SOLN: INTRAVENOUS | Status: DC

## 2021-01-27 MED ORDER — SODIUM CHLORIDE 0.9 % IV SOLN
2.0000 g | INTRAVENOUS | Status: AC
  Administered 2021-01-27: 2 g via INTRAVENOUS

## 2021-01-27 MED ORDER — ORAL CARE MOUTH RINSE: 15.0000 mL | Freq: Once | OROMUCOSAL | Status: AC

## 2021-01-27 MED ORDER — PROPOFOL 10 MG/ML IV BOLUS
INTRAVENOUS | Status: AC
  Filled 2021-01-27: qty 40

## 2021-01-27 MED ORDER — ACETAMINOPHEN 650 MG RE SUPP: 650.0000 mg | Freq: Four times a day (QID) | RECTAL | Status: DC | PRN

## 2021-01-27 MED ORDER — ENOXAPARIN SODIUM 40 MG/0.4ML ~~LOC~~ SOLN
40.0000 mg | Freq: Once | SUBCUTANEOUS | Status: AC
  Administered 2021-01-27: 40 mg via SUBCUTANEOUS

## 2021-01-27 MED ORDER — HYDROMORPHONE HCL 1 MG/ML IJ SOLN
1.0000 mg | INTRAMUSCULAR | Status: DC | PRN
Start: 2021-01-27 — End: 2021-01-29

## 2021-01-27 MED ORDER — CHLORHEXIDINE GLUCONATE CLOTH 2 % EX PADS
6.0000 | MEDICATED_PAD | Freq: Every day | CUTANEOUS | Status: DC
  Administered 2021-01-28 – 2021-01-29 (×2): 6 via TOPICAL

## 2021-01-27 MED ORDER — KETOROLAC TROMETHAMINE 30 MG/ML IJ SOLN
INTRAMUSCULAR | Status: AC
  Filled 2021-01-27: qty 1

## 2021-01-27 MED ORDER — FENTANYL CITRATE (PF) 100 MCG/2ML IJ SOLN
INTRAMUSCULAR | Status: AC
  Filled 2021-01-27: qty 2

## 2021-01-27 MED ORDER — FENTANYL CITRATE (PF) 250 MCG/5ML IJ SOLN
INTRAMUSCULAR | Status: AC
  Filled 2021-01-27: qty 5

## 2021-01-27 MED ORDER — LIDOCAINE HCL (PF) 2 % IJ SOLN
INTRAMUSCULAR | Status: AC
  Filled 2021-01-27: qty 5

## 2021-01-27 MED ORDER — EPHEDRINE 5 MG/ML INJ
INTRAVENOUS | Status: AC
  Filled 2021-01-27: qty 20

## 2021-01-27 MED ORDER — ENOXAPARIN SODIUM 40 MG/0.4ML ~~LOC~~ SOLN
SUBCUTANEOUS | Status: AC
  Filled 2021-01-27: qty 0.4

## 2021-01-27 MED ORDER — FENTANYL CITRATE (PF) 100 MCG/2ML IJ SOLN
INTRAMUSCULAR | Status: DC | PRN
  Administered 2021-01-27 (×4): 50 ug via INTRAVENOUS

## 2021-01-27 MED ORDER — ACETAMINOPHEN 325 MG PO TABS
650.0000 mg | ORAL_TABLET | Freq: Four times a day (QID) | ORAL | Status: DC | PRN
  Administered 2021-01-28: 650 mg via ORAL
  Filled 2021-01-27: qty 2

## 2021-01-27 MED ORDER — CHLORHEXIDINE GLUCONATE 0.12 % MT SOLN
15.0000 mL | Freq: Once | OROMUCOSAL | Status: AC
  Administered 2021-01-27: 15 mL via OROMUCOSAL

## 2021-01-27 MED ORDER — DIPHENHYDRAMINE HCL 12.5 MG/5ML PO ELIX: 12.5000 mg | ORAL_SOLUTION | Freq: Four times a day (QID) | ORAL | Status: DC | PRN

## 2021-01-27 MED ORDER — PANTOPRAZOLE SODIUM 40 MG IV SOLR
40.0000 mg | Freq: Every day | INTRAVENOUS | Status: DC
  Administered 2021-01-27 – 2021-01-28 (×2): 40 mg via INTRAVENOUS
  Filled 2021-01-27 (×2): qty 40

## 2021-01-27 MED ORDER — CHLORHEXIDINE GLUCONATE CLOTH 2 % EX PADS: 6.0000 | MEDICATED_PAD | Freq: Once | CUTANEOUS | Status: DC

## 2021-01-27 MED ORDER — LISINOPRIL 10 MG PO TABS
20.0000 mg | ORAL_TABLET | Freq: Every day | ORAL | Status: DC
Start: 2021-01-27 — End: 2021-01-29
  Administered 2021-01-27 – 2021-01-29 (×2): 20 mg via ORAL
  Filled 2021-01-27 (×3): qty 2

## 2021-01-27 MED ORDER — PIOGLITAZONE HCL 15 MG PO TABS
45.0000 mg | ORAL_TABLET | Freq: Every day | ORAL | Status: DC
  Administered 2021-01-28 – 2021-01-29 (×2): 45 mg via ORAL
  Filled 2021-01-27: qty 3
  Filled 2021-01-27 (×2): qty 1
  Filled 2021-01-27: qty 3

## 2021-01-27 MED ORDER — LIDOCAINE HCL (CARDIAC) PF 50 MG/5ML IV SOSY
PREFILLED_SYRINGE | INTRAVENOUS | Status: DC | PRN
  Administered 2021-01-27: 75 mg via INTRAVENOUS

## 2021-01-27 MED ORDER — DILTIAZEM HCL 60 MG PO TABS
120.0000 mg | ORAL_TABLET | Freq: Two times a day (BID) | ORAL | Status: DC
  Administered 2021-01-27 – 2021-01-29 (×3): 120 mg via ORAL
  Filled 2021-01-27 (×5): qty 2

## 2021-01-27 MED ORDER — MEPERIDINE HCL 50 MG/ML IJ SOLN: 6.2500 mg | INTRAMUSCULAR | Status: DC | PRN

## 2021-01-27 MED ORDER — SODIUM CHLORIDE (PF) 0.9 % IJ SOLN
INTRAMUSCULAR | Status: AC
  Filled 2021-01-27: qty 10

## 2021-01-27 MED ORDER — LACTATED RINGERS IV SOLN
INTRAVENOUS | Status: DC
  Administered 2021-01-27: 1000 mL via INTRAVENOUS

## 2021-01-27 MED ORDER — KETOROLAC TROMETHAMINE 15 MG/ML IJ SOLN
15.0000 mg | Freq: Four times a day (QID) | INTRAMUSCULAR | Status: AC
  Administered 2021-01-27: 15 mg via INTRAVENOUS

## 2021-01-27 MED ORDER — DIPHENHYDRAMINE HCL 50 MG/ML IJ SOLN: 12.5000 mg | Freq: Four times a day (QID) | INTRAMUSCULAR | Status: DC | PRN

## 2021-01-27 MED ORDER — POVIDONE-IODINE 10 % EX OINT
TOPICAL_OINTMENT | CUTANEOUS | Status: AC
  Filled 2021-01-27: qty 2

## 2021-01-27 MED ORDER — CHLORHEXIDINE GLUCONATE 0.12 % MT SOLN
OROMUCOSAL | Status: AC
  Filled 2021-01-27: qty 15

## 2021-01-27 MED ORDER — ALVIMOPAN 12 MG PO CAPS
12.0000 mg | ORAL_CAPSULE | ORAL | Status: AC
  Administered 2021-01-27: 12 mg via ORAL

## 2021-01-27 MED ORDER — PROMETHAZINE HCL 25 MG/ML IJ SOLN
6.2500 mg | INTRAMUSCULAR | Status: DC | PRN
  Administered 2021-01-27: 12.5 mg via INTRAVENOUS
  Filled 2021-01-27: qty 1

## 2021-01-27 MED ORDER — ROCURONIUM BROMIDE 10 MG/ML (PF) SYRINGE
PREFILLED_SYRINGE | INTRAVENOUS | Status: AC
  Filled 2021-01-27: qty 20

## 2021-01-27 MED ORDER — PROPOFOL 10 MG/ML IV BOLUS
INTRAVENOUS | Status: DC | PRN
  Administered 2021-01-27: 200 mg via INTRAVENOUS

## 2021-01-27 MED ORDER — SUCCINYLCHOLINE CHLORIDE 200 MG/10ML IV SOSY
PREFILLED_SYRINGE | INTRAVENOUS | Status: AC
  Filled 2021-01-27: qty 30

## 2021-01-27 MED ORDER — INSULIN ASPART 100 UNIT/ML ~~LOC~~ SOLN
0.0000 [IU] | Freq: Three times a day (TID) | SUBCUTANEOUS | Status: DC
  Administered 2021-01-27: 2 [IU] via SUBCUTANEOUS
  Administered 2021-01-28: 3 [IU] via SUBCUTANEOUS
  Administered 2021-01-28: 5 [IU] via SUBCUTANEOUS

## 2021-01-27 MED ORDER — ONDANSETRON HCL 4 MG/2ML IJ SOLN
INTRAMUSCULAR | Status: AC
  Filled 2021-01-27: qty 2

## 2021-01-27 MED ORDER — SODIUM CHLORIDE 0.9 % IV SOLN
INTRAVENOUS | Status: AC
  Filled 2021-01-27: qty 2

## 2021-01-27 MED ORDER — 0.9 % SODIUM CHLORIDE (POUR BTL) OPTIME
TOPICAL | Status: DC | PRN
  Administered 2021-01-27: 2000 mL

## 2021-01-27 MED ORDER — ONDANSETRON HCL 4 MG/2ML IJ SOLN
INTRAMUSCULAR | Status: DC | PRN
  Administered 2021-01-27: 4 mg via INTRAVENOUS

## 2021-01-27 MED ORDER — ONDANSETRON 4 MG PO TBDP: 4.0000 mg | ORAL_TABLET | Freq: Four times a day (QID) | ORAL | Status: DC | PRN

## 2021-01-27 MED ORDER — KETOROLAC TROMETHAMINE 15 MG/ML IJ SOLN
INTRAMUSCULAR | Status: AC
  Filled 2021-01-27: qty 1

## 2021-01-27 MED ORDER — ETOMIDATE 2 MG/ML IV SOLN
INTRAVENOUS | Status: AC
  Filled 2021-01-27: qty 10

## 2021-01-27 MED ORDER — BUPIVACAINE LIPOSOME 1.3 % IJ SUSP
INTRAMUSCULAR | Status: DC | PRN
  Administered 2021-01-27: 20 mL

## 2021-01-27 MED ORDER — ALVIMOPAN 12 MG PO CAPS
ORAL_CAPSULE | ORAL | Status: AC
  Filled 2021-01-27: qty 1

## 2021-01-27 MED ORDER — MIDAZOLAM HCL 2 MG/2ML IJ SOLN
INTRAMUSCULAR | Status: AC
  Filled 2021-01-27: qty 2

## 2021-01-27 MED ORDER — HYDROCODONE-ACETAMINOPHEN 5-325 MG PO TABS
1.0000 | ORAL_TABLET | ORAL | Status: DC | PRN
  Administered 2021-01-27: 2 via ORAL
  Administered 2021-01-27: 1 via ORAL
  Administered 2021-01-28: 2 via ORAL
  Filled 2021-01-27: qty 1
  Filled 2021-01-27 (×2): qty 2

## 2021-01-27 MED ORDER — KETOROLAC TROMETHAMINE 15 MG/ML IJ SOLN
15.0000 mg | Freq: Four times a day (QID) | INTRAMUSCULAR | Status: DC | PRN
  Administered 2021-01-27 – 2021-01-29 (×3): 15 mg via INTRAVENOUS
  Filled 2021-01-27 (×3): qty 1

## 2021-01-27 MED ORDER — BUPIVACAINE LIPOSOME 1.3 % IJ SUSP
INTRAMUSCULAR | Status: AC
  Filled 2021-01-27: qty 20

## 2021-01-27 MED ORDER — HYDROMORPHONE HCL 1 MG/ML IJ SOLN
0.2500 mg | INTRAMUSCULAR | Status: DC | PRN
  Administered 2021-01-27 (×4): 0.5 mg via INTRAVENOUS
  Filled 2021-01-27 (×4): qty 0.5

## 2021-01-27 MED ORDER — LORAZEPAM 2 MG/ML IJ SOLN: 1.0000 mg | INTRAMUSCULAR | Status: DC | PRN

## 2021-01-27 MED ORDER — ROCURONIUM 10MG/ML (10ML) SYRINGE FOR MEDFUSION PUMP - OPTIME
INTRAVENOUS | Status: DC | PRN
  Administered 2021-01-27: 60 mg via INTRAVENOUS
  Administered 2021-01-27: 10 mg via INTRAVENOUS

## 2021-01-27 MED ORDER — ONDANSETRON HCL 4 MG/2ML IJ SOLN: 4.0000 mg | Freq: Four times a day (QID) | INTRAMUSCULAR | Status: DC | PRN

## 2021-01-27 MED ORDER — SUGAMMADEX SODIUM 200 MG/2ML IV SOLN
INTRAVENOUS | Status: DC | PRN
  Administered 2021-01-27: 200 mg via INTRAVENOUS

## 2021-01-27 MED ORDER — POVIDONE-IODINE 10 % OINT PACKET
TOPICAL_OINTMENT | CUTANEOUS | Status: DC | PRN
  Administered 2021-01-27: 1 via TOPICAL

## 2021-01-27 SURGICAL SUPPLY — 45 items
BARRIER SKIN 2 3/4 (OSTOMY) IMPLANT
BARRIER SKIN OD2.25 2 3/4 FLNG (OSTOMY) IMPLANT
BRR SKN FLT 2.75X2.25 2 PC (OSTOMY)
CLAMP POUCH DRAINAGE QUIET (OSTOMY) IMPLANT
COVER LIGHT HANDLE STERIS (MISCELLANEOUS) ×8 IMPLANT
COVER WAND RF STERILE (DRAPES) ×2 IMPLANT
DRSG OPSITE POSTOP 4X10 (GAUZE/BANDAGES/DRESSINGS) ×1 IMPLANT
ELECT REM PT RETURN 9FT ADLT (ELECTROSURGICAL) ×2
ELECTRODE REM PT RTRN 9FT ADLT (ELECTROSURGICAL) ×1 IMPLANT
GLOVE SURG ENC MOIS LTX SZ6.5 (GLOVE) ×4 IMPLANT
GLOVE SURG SS PI 7.5 STRL IVOR (GLOVE) ×4 IMPLANT
GLOVE SURG UNDER POLY LF SZ6.5 (GLOVE) ×4 IMPLANT
GLOVE SURG UNDER POLY LF SZ7 (GLOVE) ×12 IMPLANT
GOWN STRL REUS W/TWL LRG LVL3 (GOWN DISPOSABLE) ×12 IMPLANT
HEMOSTAT SURGICEL 4X8 (HEMOSTASIS) ×1 IMPLANT
INST SET MAJOR GENERAL (KITS) ×2 IMPLANT
KIT TURNOVER KIT A (KITS) ×2 IMPLANT
LIGASURE IMPACT 36 18CM CVD LR (INSTRUMENTS) ×2 IMPLANT
MANIFOLD NEPTUNE II (INSTRUMENTS) ×2 IMPLANT
NDL HYPO 18GX1.5 BLUNT FILL (NEEDLE) ×1 IMPLANT
NEEDLE HYPO 18GX1.5 BLUNT FILL (NEEDLE) ×2 IMPLANT
NS IRRIG 1000ML POUR BTL (IV SOLUTION) ×4 IMPLANT
PACK COLON (CUSTOM PROCEDURE TRAY) ×2 IMPLANT
PAD ARMBOARD 7.5X6 YLW CONV (MISCELLANEOUS) ×2 IMPLANT
PENCIL SMOKE EVACUATOR COATED (MISCELLANEOUS) ×2 IMPLANT
RELOAD LINEAR CUT PROX 55 BLUE (ENDOMECHANICALS) ×2 IMPLANT
RELOAD PROXIMATE 75MM BLUE (ENDOMECHANICALS) IMPLANT
RELOAD STAPLE 55 3.8 BLU REG (ENDOMECHANICALS) IMPLANT
RELOAD STAPLE 75 3.8 BLU REG (ENDOMECHANICALS) IMPLANT
RETRACTOR WND ALEXIS-O 25 LRG (MISCELLANEOUS) IMPLANT
RTRCTR WOUND ALEXIS O 25CM LRG (MISCELLANEOUS) ×2
SPONGE LAP 18X18 RF (DISPOSABLE) ×5 IMPLANT
SPONGE SURGIFOAM ABS GEL 100 (HEMOSTASIS) ×1 IMPLANT
STAPLER GUN LINEAR PROX 60 (STAPLE) ×2 IMPLANT
STAPLER PROXIMATE 55 BLUE (STAPLE) ×2 IMPLANT
STAPLER PROXIMATE 75MM BLUE (STAPLE) ×1 IMPLANT
STAPLER VISISTAT (STAPLE) ×2 IMPLANT
SUT CHROMIC 0 SH (SUTURE) IMPLANT
SUT CHROMIC 2 0 SH (SUTURE) ×1 IMPLANT
SUT PDS AB 0 CTX 60 (SUTURE) ×2 IMPLANT
SUT SILK 2 0 (SUTURE)
SUT SILK 2-0 18XBRD TIE 12 (SUTURE) IMPLANT
SUT SILK 3 0 SH CR/8 (SUTURE) ×3 IMPLANT
TRAY FOLEY MTR SLVR 16FR STAT (SET/KITS/TRAYS/PACK) ×2 IMPLANT
YANKAUER SUCT BULB TIP 10FT TU (MISCELLANEOUS) ×2 IMPLANT

## 2021-01-27 NOTE — Anesthesia Procedure Notes (Signed)
Procedure Name: Intubation Date/Time: 01/27/2021 7:48 AM Performed by: Ollen Bowl, CRNA Pre-anesthesia Checklist: Patient identified, Emergency Drugs available, Suction available and Patient being monitored Patient Re-evaluated:Patient Re-evaluated prior to induction Oxygen Delivery Method: Circle system utilized Preoxygenation: Pre-oxygenation with 100% oxygen Induction Type: IV induction Ventilation: Mask ventilation without difficulty Tube type: Oral Number of attempts: 1 Airway Equipment and Method: Stylet and Oral airway Placement Confirmation: ETT inserted through vocal cords under direct vision,  positive ETCO2 and breath sounds checked- equal and bilateral Tube secured with: Tape Dental Injury: Teeth and Oropharynx as per pre-operative assessment

## 2021-01-27 NOTE — Progress Notes (Signed)
Pt arrived to room via bed from PACU. Alert and oriented, denies c/o pain at present. Honeycomb dressing dry and intact to midline abd incision with old drainage noted and marked by PACU staff. Bowel sounds (+) x4. Resp even and nonlabored, snoring occasionally once falls asleep. Pt remains on 3lpm [place] until more awake. VSS. Foley cath intact draining clear yellow urine. IV site WNL, no s/s infiltration.

## 2021-01-27 NOTE — Anesthesia Postprocedure Evaluation (Signed)
Anesthesia Post Note  Patient: [facility]  Procedure(s) Performed: RIGHT HEMI-COLECTOMY (N/A Abdomen)  Patient location during evaluation: PACU Anesthesia Type: General Level of consciousness: awake and alert and oriented Pain management: pain level controlled Vital Signs Assessment: post-procedure vital signs reviewed and stable Respiratory status: spontaneous breathing Cardiovascular status: blood pressure returned to baseline and stable Postop Assessment: no apparent nausea or vomiting Anesthetic complications: no   No complications documented.   Last Vitals:  Vitals:   01/27/21 1130 01/27/21 1145  BP: (!) 138/97 134/87  Pulse: 89 86  Resp: 16 12  Temp:    SpO2: 96% 95%    Last Pain:  Vitals:   01/27/21 1130  TempSrc:   PainSc: 4                  Aleaya Latona

## 2021-01-27 NOTE — Anesthesia Preprocedure Evaluation (Addendum)
Anesthesia Evaluation  Patient identified by MRN, date of birth, ID band Patient awake    Reviewed: Allergy & Precautions, NPO status , Patient's Chart, lab work & pertinent test results  History of Anesthesia Complications Negative for: history of anesthetic complications  Airway Mallampati: II  TM Distance: >3 FB Neck ROM: Full    Dental  (+) Dental Advisory Given, Chipped Multiple chipped teeth:   Pulmonary neg pulmonary ROS,    Pulmonary exam normal breath sounds clear to auscultation       Cardiovascular Exercise Tolerance: Good hypertension, Pt. on medications Normal cardiovascular exam Rhythm:Regular Rate:Normal  05-Dec-2020 14:37:43 [facility]-AP-OPS ROUTINE RECORD Normal sinus rhythm Incomplete right bundle branch block Cannot rule out Inferior infarct , age undetermined Abnormal ECG Confirmed by Asencion Noble [id number]) on 12/06/2020 2:33:07 PM   Neuro/Psych negative neurological ROS  negative psych ROS   GI/Hepatic Neg liver ROS, GERD  Medicated and Controlled,  Endo/Other  diabetes, Well Controlled, Type 2, Oral Hypoglycemic Agents  Renal/GU negative Renal ROS     Musculoskeletal negative musculoskeletal ROS (+)   Abdominal   Peds  Hematology negative hematology ROS (+)   Anesthesia Other Findings   Reproductive/Obstetrics negative OB ROS                           Anesthesia Physical Anesthesia Plan  ASA: II  Anesthesia Plan: General   Post-op Pain Management:    Induction: Intravenous  PONV Risk Score and Plan: Ondansetron, Midazolam and Dexamethasone  Airway Management Planned: Oral ETT  Additional Equipment:   Intra-op Plan:   Post-operative Plan: Extubation in OR  Informed Consent: I have reviewed the patients History and Physical, chart, labs and discussed the procedure including the risks, benefits and alternatives for the proposed anesthesia  with the patient or authorized representative who has indicated his/her understanding and acceptance.       Plan Discussed with: CRNA and Surgeon  Anesthesia Plan Comments:         Anesthesia Quick Evaluation

## 2021-01-27 NOTE — Transfer of Care (Signed)
Immediate Anesthesia Transfer of Care Note  Patient: [facility]  Procedure(s) Performed: RIGHT HEMI-COLECTOMY (N/A Abdomen)  Patient Location: PACU  Anesthesia Type:General  Level of Consciousness: awake  Airway & Oxygen Therapy: Patient Spontanous Breathing  Post-op Assessment: Report given to RN  Post vital signs: Reviewed and stable  Last Vitals:  Vitals Value Taken Time  BP 127/84 01/27/21 0924  Temp    Pulse 79 01/27/21 0927  Resp 11 01/27/21 0927  SpO2 94 % 01/27/21 0927  Vitals shown include unvalidated device data.  Last Pain:  Vitals:   01/27/21 0648  TempSrc: Oral      Patients Stated Pain Goal: 8 (56/[id number]/94 [id number])  Complications: No complications documented.

## 2021-01-27 NOTE — Progress Notes (Signed)
Patient ambulated to bathroom with standby assist. Patient states he had moderate size purple colored stool. Patient flushed toilet before nurse could observe. Ambulated back to bed without difficulty. Patient denies any severe pain- 2 out of 10. Abdominal dressing dry and intact. Patient completed incentive spirometry to 1500 x 5 with good effort. No complaints of nausea/vomiting, tolerating liquids.

## 2021-01-27 NOTE — Interval H&P Note (Signed)
History and Physical Interval Note:  01/27/2021 7:06 AM  [facility]  has presented today for surgery, with the diagnosis of Colon polyps.  The various methods of treatment have been discussed with the patient and family. After consideration of risks, benefits and other options for treatment, the patient has consented to  Procedure(s): PARTIAL COLECTOMY (N/A) as a surgical intervention.  The patient's history has been reviewed, patient examined, no change in status, stable for surgery.  I have reviewed the patient's chart and labs.  Questions were answered to the patient's satisfaction.     Aviva Signs

## 2021-01-27 NOTE — Op Note (Signed)
Patient:  Christian Woodward  DOB:  04-04-1951  MRN:  [id number]   Preop Diagnosis: Multiple adenomatous polyps, ascending and proximal transverse colon  Postop Diagnosis: Same  Procedure: Right hemicolectomy  Surgeon: Aviva Signs, MD  Assistant: Curlene Labrum, MD  Anes: General endotracheal  Indications: Patient is a 70 year old white male who underwent a colonoscopy and was found to have multiple polyps in the ascending colon.  Final pathology revealed tubular adenomas without dysplasia.  A polyp in the cecum as well as a polyp in the proximal transverse colon could not be fully resected.  Patient now presents for right hemicolectomy.  The risks and benefits of the procedure including bleeding, infection, cardiopulmonary difficulties, anastomotic leak, and the possibility of a blood transfusion were fully explained to the patient, who gave informed consent.  Procedure note: The patient was placed in the supine position.  After induction of general endotracheal anesthesia, the abdomen was prepped and draped using the usual sterile technique with ChloraPrep.  Surgical site confirmation was performed.  Midline incision was made from just to the right of the umbilicus superiorly.  The peritoneal cavity was entered into without difficulty.  The liver was inspected and no palpable lesions were noted.  The right colon was mobilized along its peritoneal reflection.  The hepatic flexure was taken down using the LigaSure.  Care was taken to avoid the duodenum which was visualized.  The previously tattooed proximal transverse colon was found.  A GIA-75 stapler was placed across the mid transverse colon and fired.  A GIA 55 stapler was placed across the terminal ileum and fired.  The right colon and hepatic flexure were taken down using the LigaSure.  The specimen was then sent to pathology further examination.  A side to side ileocolic anastomosis was performed using a GIA-75 stapler.  The enterotomy  was closed using a TA 60 stapler.  The staple line was bolstered using 3-0 silk Lembert sutures.  Surrounding omentum was placed over the anastomosis and secured using 3-0 silk sutures.  The mesenteric defect was closed using a 2-0 Chromic Gut running suture.  The abdominal cavity was then copiously irrigated with normal saline.  Surgicel wrapped around Gelfoam was then placed along the right paracolic gutter.  The mesentery was inspected and no abnormal bleeding was noted.  All operating personnel then changed her gown and gloves.  A new set up was used for closure.  The midline incision was closed using a looped 0 PDS running suture.  Subcutaneous layer was irrigated with normal saline.  Exparel was instilled into the surrounding wound.  The skin was closed using staples.  Betadine ointment and a dry sterile dressing were applied.  All tape and needle counts were correct at the end of the procedure.  The patient was extubated in the operating room and transferred to PACU in stable condition.  Complications: None  EBL: 150 cc  Specimen: Right colon

## 2021-01-28 LAB — BASIC METABOLIC PANEL
Anion gap: 5 (ref 5–15)
BUN: 11 mg/dL (ref 8–23)
CO2: 23 mmol/L (ref 22–32)
Calcium: 8.4 mg/dL — ABNORMAL LOW (ref 8.9–10.3)
Chloride: 107 mmol/L (ref 98–111)
Creatinine, Ser: 0.73 mg/dL (ref 0.61–1.24)
GFR, Estimated: >60 mL/min (ref >60)
Glucose, Bld: 176 mg/dL — ABNORMAL HIGH (ref 70–99)
Potassium: 3.9 mmol/L (ref 3.5–5.1)
Sodium: 135 mmol/L (ref 135–145)

## 2021-01-28 LAB — GLUCOSE, CAPILLARY
Glucose-Capillary: 162 mg/dL — ABNORMAL HIGH (ref 70–99)
Glucose-Capillary: 208 mg/dL — ABNORMAL HIGH (ref 70–99)

## 2021-01-28 LAB — HEMOGLOBIN A1C
Hgb A1c MFr Bld: 7.2 % — ABNORMAL HIGH (ref 4.8–5.6)
Mean Plasma Glucose: 160 mg/dL

## 2021-01-28 LAB — CBC
HCT: 30.7 % — ABNORMAL LOW (ref 39.0–52.0)
Hemoglobin: 10.2 g/dL — ABNORMAL LOW (ref 13.0–17.0)
MCH: 33 pg (ref 26.0–34.0)
MCHC: 33.2 g/dL (ref 30.0–36.0)
MCV: 99.4 fL (ref 80.0–100.0)
Platelets: 124 10*3/uL — ABNORMAL LOW (ref 150–400)
RBC: 3.09 MIL/uL — ABNORMAL LOW (ref 4.22–5.81)
RDW: 13.9 % (ref 11.5–15.5)
WBC: 6.3 10*3/uL (ref 4.0–10.5)
nRBC: 0 % (ref 0.0–0.2)

## 2021-01-28 LAB — MAGNESIUM: Magnesium: 1.9 mg/dL (ref 1.7–2.4)

## 2021-01-28 LAB — PHOSPHORUS: Phosphorus: 3.2 mg/dL (ref 2.5–4.6)

## 2021-01-28 MED ORDER — TRAMADOL HCL 50 MG PO TABS
50.0000 mg | ORAL_TABLET | Freq: Four times a day (QID) | ORAL | Status: DC | PRN
  Administered 2021-01-28 – 2021-01-29 (×2): 50 mg via ORAL
  Filled 2021-01-28 (×2): qty 1

## 2021-01-28 NOTE — Progress Notes (Signed)
1 Day Post-Op  Subjective: Patient having mild incisional pain.  He denies any dizziness.  He has had multiple bowel movements with some blood in it.  Objective: Vital signs in last 24 hours: Temp:  [98.2 F (36.8 C)-99.4 F (37.4 C)] 99.4 F (37.4 C) (02/15 0907) Pulse Rate:  [79-117] 87 (02/15 0907) Resp:  [10-18] 16 (02/15 0907) BP: (99-138)/(64-97) 104/68 (02/15 0907) SpO2:  [94 %-100 %] 94 % (02/15 0907) Last BM Date: 01/27/21  Intake/Output from previous day: 02/14 0701 - 02/15 0700 In: 3627.1 [P.O.:480; I.V.:3147.1] Out: 1526 [Urine:1375; Stool:1; Blood:150] Intake/Output this shift: No intake/output data recorded.  General appearance: alert, cooperative and no distress Resp: clear to auscultation bilaterally Cardio: regular rate and rhythm, S1, S2 normal, no murmur, click, rub or gallop GI: Soft, incision healing well.  Occasional bowel sounds appreciated.  Lab Results:  Recent Labs    01/28/21 0445  WBC 6.3  HGB 10.2*  HCT 30.7*  PLT 124*   BMET Recent Labs    01/28/21 0445  NA 135  K 3.9  CL 107  CO2 23  GLUCOSE 176*  BUN 11  CREATININE 0.73  CALCIUM 8.4*   PT/INR No results for input(s): LABPROT, INR in the last 72 hours.  Studies/Results: No results found.  Anti-infectives: Anti-infectives (From admission, onward)   Start     Dose/Rate Route Frequency Ordered Stop   01/27/21 0700  cefoTEtan (CEFOTAN) 2 g in sodium chloride 0.9 % 100 mL IVPB        2 g 200 mL/hr over 30 Minutes Intravenous On call to O.R. 01/27/21 9390 01/27/21 0750      Assessment/Plan: s/p Procedure(s): RIGHT HEMI-COLECTOMY Impression: Stable on postoperative day 1.  His mild soft blood pressure may be secondary to recently received pain medication.  Will adjust this.  We will advance his diet to heart healthy diet.  Will stop Entereg.  Will recheck labs in a.m.  Hemoglobin has dropped secondary to surgery and IV hydration.  LOS: 1 day    Aviva Signs 01/28/2021

## 2021-01-28 NOTE — Progress Notes (Signed)
Pt called asking for IV to be removed. States it's bothering him and hard for him to move around with the IV pole. IVF stopped and site saline locked. MD Arnoldo Morale notified via AMION of pt request. Order received to discontinue IVF. Pt notified of MD response.

## 2021-01-29 ENCOUNTER — Encounter (HOSPITAL_COMMUNITY): Payer: Self-pay | Admitting: General Surgery

## 2021-01-29 LAB — BASIC METABOLIC PANEL
Anion gap: 9 (ref 5–15)
BUN: 13 mg/dL (ref 8–23)
CO2: 23 mmol/L (ref 22–32)
Calcium: 8.9 mg/dL (ref 8.9–10.3)
Chloride: 104 mmol/L (ref 98–111)
Creatinine, Ser: 0.8 mg/dL (ref 0.61–1.24)
GFR, Estimated: >60 mL/min (ref >60)
Glucose, Bld: 184 mg/dL — ABNORMAL HIGH (ref 70–99)
Potassium: 3.7 mmol/L (ref 3.5–5.1)
Sodium: 136 mmol/L (ref 135–145)

## 2021-01-29 LAB — CBC
HCT: 30.1 % — ABNORMAL LOW (ref 39.0–52.0)
Hemoglobin: 10.4 g/dL — ABNORMAL LOW (ref 13.0–17.0)
MCH: 33.4 pg (ref 26.0–34.0)
MCHC: 34.6 g/dL (ref 30.0–36.0)
MCV: 96.8 fL (ref 80.0–100.0)
Platelets: 121 10*3/uL — ABNORMAL LOW (ref 150–400)
RBC: 3.11 MIL/uL — ABNORMAL LOW (ref 4.22–5.81)
RDW: 13.7 % (ref 11.5–15.5)
WBC: 7 10*3/uL (ref 4.0–10.5)
nRBC: 0 % (ref 0.0–0.2)

## 2021-01-29 LAB — PHOSPHORUS: Phosphorus: 3 mg/dL (ref 2.5–4.6)

## 2021-01-29 LAB — SURGICAL PATHOLOGY

## 2021-01-29 LAB — MAGNESIUM: Magnesium: 1.8 mg/dL (ref 1.7–2.4)

## 2021-01-29 MED ORDER — TRAMADOL HCL 50 MG PO TABS: 50.0000 mg | ORAL_TABLET | Freq: Four times a day (QID) | ORAL | 0 refills | Status: AC | PRN

## 2021-01-29 NOTE — Discharge Summary (Signed)
Physician Discharge Summary  Patient ID: Christian Woodward MRN: [id number] DOB/AGE: 69-Oct-1952 69 y.o.  Admit date: 01/27/2021 Discharge date: 01/29/2021  Admission Diagnoses: ascending colon polyps  Discharge Diagnoses: Same Active Problems:   Adenomatous polyp of ascending colon   S/P partial colectomy Non-insulin-dependent diabetes mellitus, hypertension  Discharged Condition: good  Hospital Course: Patient is a 70 year old white male who was referred to my care for ascending colon polyps that could not be removed endoscopically.  He underwent a right hemicolectomy on 01/27/2021.  He tolerated the surgery well.  His postoperative course was unremarkable.  His diet was advanced without difficulty.  Final pathology is still pending.  The patient is being discharged home on 01/29/2021 in good and improving condition.  Treatments: surgery: Right hemicolectomy on 01/27/2021  Discharge Exam: Blood pressure 112/78, pulse 92, temperature 98.7 F (37.1 C), temperature source Oral, resp. rate 16, height 5\' 10"  (1.778 m), weight 104.3 kg, SpO2 95 %. General appearance: alert, cooperative and no distress Resp: clear to auscultation bilaterally Cardio: regular rate and rhythm, S1, S2 normal, no murmur, click, rub or gallop GI: Soft, incision healing well.  Disposition: Discharge disposition: 01-Home or Self Care       Discharge Instructions    Diet - low sodium heart healthy   Complete by: As directed    Increase activity slowly   Complete by: As directed      Allergies as of 01/29/2021   No Known Allergies     Medication List    STOP taking these medications   metroNIDAZOLE 500 MG tablet Commonly known as: Flagyl   neomycin 500 MG tablet Commonly known as: MYCIFRADIN     TAKE these medications   diltiazem 120 MG tablet Commonly known as: CARDIZEM Take 120 mg by mouth 2 (two) times daily.   glipiZIDE 10 MG 24 hr tablet Commonly known as: GLUCOTROL XL Take 10 mg by mouth  daily.   ibuprofen 200 MG tablet Commonly known as: ADVIL Take 400 mg by mouth every 6 (six) hours as needed for headache or moderate pain.   lisinopril 20 MG tablet Commonly known as: ZESTRIL Take 20 mg by mouth daily.   metFORMIN 500 MG 24 hr tablet Commonly known as: GLUCOPHAGE-XR Take 1,000 mg by mouth 2 (two) times daily.   pioglitazone 45 MG tablet Commonly known as: ACTOS Take 45 mg by mouth every morning.   pravastatin 40 MG tablet Commonly known as: PRAVACHOL Take 40 mg by mouth daily.   tadalafil 20 MG tablet Commonly known as: CIALIS Take 20 mg by mouth daily as needed for erectile dysfunction.   traMADol 50 MG tablet Commonly known as: Ultram Take 1 tablet (50 mg total) by mouth every 6 (six) hours as needed.   traZODone 100 MG tablet Commonly known as: DESYREL Take 100 mg by mouth at bedtime.       Follow-up Information    Aviva Signs, MD. Schedule an appointment as soon as possible for a visit on 02/06/2021.   Specialty: General Surgery Contact information: [id number] [address] [phone]               Signed: Aviva Signs 01/29/2021, 9:58 AM

## 2021-01-29 NOTE — Progress Notes (Signed)
Inpatient Diabetes Program Recommendations  AACE/ADA: New Consensus Statement on Inpatient Glycemic Control   Target Ranges:  Prepandial:   less than 140 mg/dL      Peak postprandial:   less than 180 mg/dL (1-2 hours)      Critically ill patients:  140 - 180 mg/dL  Results for WONG, STEADHAM (MRN [id number]) as of 01/29/2021 09:40  Ref. Range 01/29/2021 04:30  Glucose Latest Ref Range: 70 - 99 mg/dL 184 (H)   Results for AVIAN, KONIGSBERG (MRN [id number]) as of 01/29/2021 09:40  Ref. Range 01/28/2021 07:31 01/28/2021 11:30  Glucose-Capillary Latest Ref Range: 70 - 99 mg/dL 162 (H) 208 (H)   Review of Glycemic Control  Diabetes history: DM2 Outpatient Diabetes medications: Glipizide XL 10 mg daily, Metformin XR 1000 mg BID, Actos 45 mg daily Current orders for Inpatient glycemic control: Actos 45 mg QAM  Inpatient Diabetes Program Recommendations:    Insulin: Noted Novolog correction scale was ordered but was discontinued 01/28/21. Lab glucose 184 mg/dl today. Please reorder CBGs AC&HS with Novolog 0-15 units TID with meals and Novolog 0-5 units QHS.  Thanks, Barnie Alderman, RN, MSN, CDE Diabetes Coordinator Inpatient Diabetes Program [phone] (Team Pager from 8am to 5pm)

## 2021-01-29 NOTE — Plan of Care (Signed)
  Problem: Health Behavior/Discharge Planning: Goal: Ability to manage health-related needs will improve Outcome: Adequate for Discharge   Problem: Clinical Measurements: Goal: Ability to maintain clinical measurements within normal limits will improve Outcome: Adequate for Discharge Goal: Will remain free from infection Outcome: Adequate for Discharge Goal: Diagnostic test results will improve Outcome: Adequate for Discharge Goal: Respiratory complications will improve Outcome: Adequate for Discharge Goal: Cardiovascular complication will be avoided Outcome: Adequate for Discharge   Problem: Activity: Goal: Risk for activity intolerance will decrease Outcome: Adequate for Discharge   Problem: Nutrition: Goal: Adequate nutrition will be maintained Outcome: Adequate for Discharge   Problem: Coping: Goal: Level of anxiety will decrease Outcome: Adequate for Discharge   Problem: Elimination: Goal: Will not experience complications related to bowel motility Outcome: Adequate for Discharge Goal: Will not experience complications related to urinary retention Outcome: Adequate for Discharge   Problem: Pain Managment: Goal: General experience of comfort will improve Outcome: Adequate for Discharge   Problem: Safety: Goal: Ability to remain free from injury will improve Outcome: Adequate for Discharge   Problem: Skin Integrity: Goal: Risk for impaired skin integrity will decrease Outcome: Adequate for Discharge   Problem: Education: Goal: Knowledge of General Education information will improve Description: Including pain rating scale, medication(s)/side effects and non-pharmacologic comfort measures Outcome: Adequate for Discharge   Problem: Health Behavior/Discharge Planning: Goal: Ability to manage health-related needs will improve Outcome: Adequate for Discharge   Problem: Activity: Goal: Risk for activity intolerance will decrease Outcome: Adequate for Discharge    Problem: Nutrition: Goal: Adequate nutrition will be maintained Outcome: Adequate for Discharge   Problem: Coping: Goal: Level of anxiety will decrease Outcome: Adequate for Discharge   Problem: Skin Integrity: Goal: Risk for impaired skin integrity will decrease Outcome: Adequate for Discharge

## 2021-02-06 ENCOUNTER — Ambulatory Visit (INDEPENDENT_AMBULATORY_CARE_PROVIDER_SITE_OTHER): Payer: Self-pay | Admitting: General Surgery

## 2021-02-06 ENCOUNTER — Encounter: Payer: Self-pay | Admitting: General Surgery

## 2021-02-06 ENCOUNTER — Other Ambulatory Visit: Payer: Self-pay

## 2021-02-06 VITALS — BP 112/75 | HR 94 | Temp 97.6°F | Resp 18 | Ht 70.0 in | Wt 227.0 lb

## 2021-02-06 DIAGNOSIS — Z09 Encounter for follow-up examination after completed treatment for conditions other than malignant neoplasm: Secondary | ICD-10-CM

## 2021-02-06 NOTE — Progress Notes (Signed)
Subjective:     Christian Woodward  Here for postoperative visit, status post right hemicolectomy.  He is doing very well.  He has no incisional pain.  His bowel movements have normalized.  He is passing flatus a little bit more than usual.  No blood is present in his bowels. Objective:    BP 112/75   Pulse 94   Temp 97.6 F (36.4 C) (Other (Comment))   Resp 18   Ht 5\' 10"  (1.778 m)   Wt 227 lb (103 kg)   SpO2 97%   BMI 32.57 kg/m   General:  alert, cooperative and no distress  Abdomen soft, incision healing well.  Staples removed, Steri-Strips applied. Final pathology reveals no malignancy.  Multiple tubular adenomas removed.  No dysplasia noted.  Patient aware pathology results.     Assessment:    Doing well postoperatively.    Plan:   Increase activity as able.  Follow-up here as needed.

## 2021-03-24 IMAGING — MR MR LUMBAR SPINE W/O CM
5 series · 32 of 48 positions shown · non-contrast
Comparison: Lumbar spine radiographs 07/24/2020

CLINICAL DATA: Low back pain with bilateral leg pain.  No injury.

EXAM:
MRI LUMBAR SPINE WITHOUT CONTRAST
TECHNIQUE: Multiplanar, multisequence MR imaging of the lumbar spine was
performed. No intravenous contrast was administered.

[Series 5: T2 · sagittal · 4.0mm · 0.88mm/px · 6 of 13 slices shown (1 of 2)]
[im 1/13]
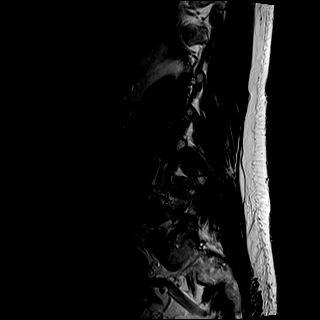
[im 3/13]
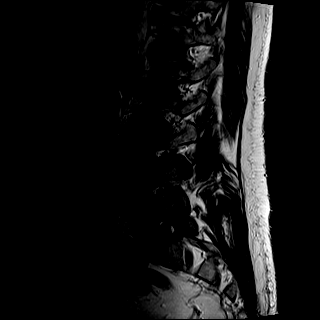
[im 5/13]
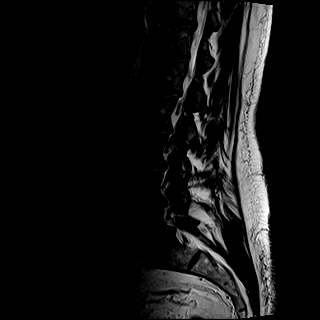
[im 8/13]
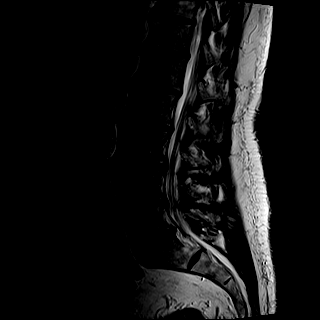
[im 10/13]
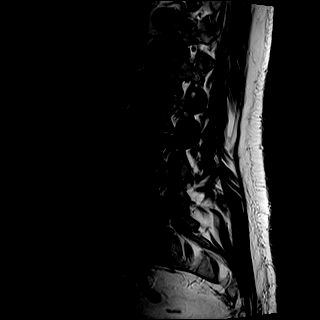
[im 13/13]
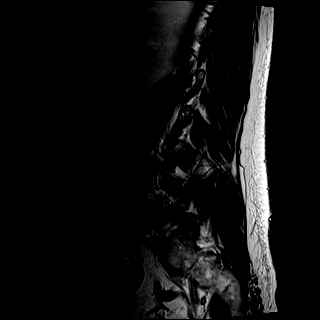

[Series 6: T1 · sagittal · 4.0mm · 0.88mm/px · 6 of 13 slices shown (1 of 2)]
[im 1/13]
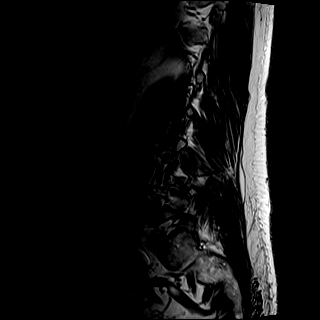
[im 3/13]
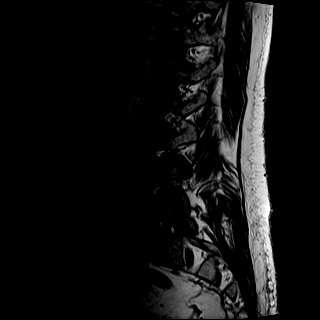
[im 5/13]
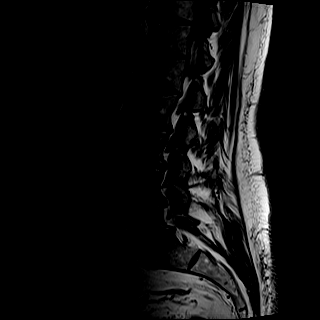
[im 8/13]
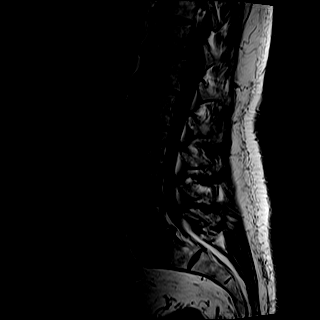
[im 10/13]
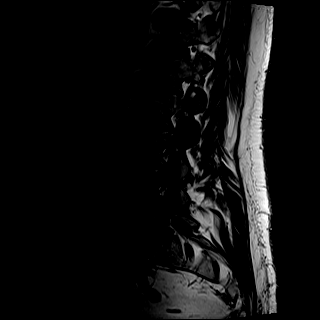
[im 13/13]
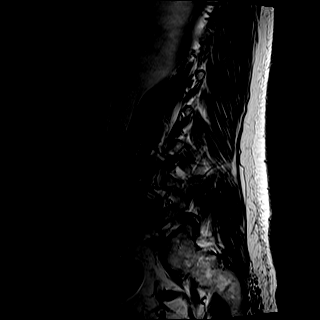

[Series 7: STIR · sagittal · 4.0mm · 0.55mm/px · 2 of 15 slices shown]
[im 1/15]
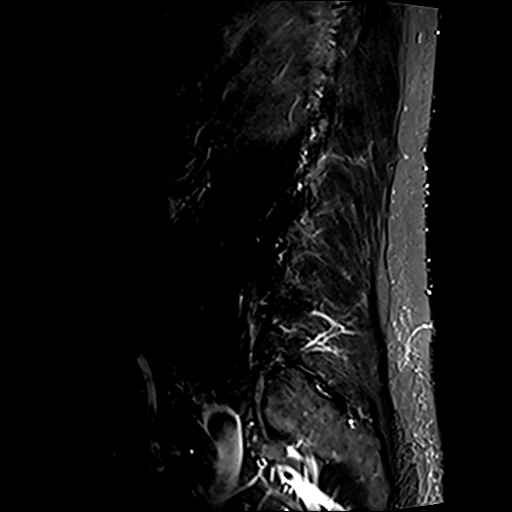
[im 3/15]
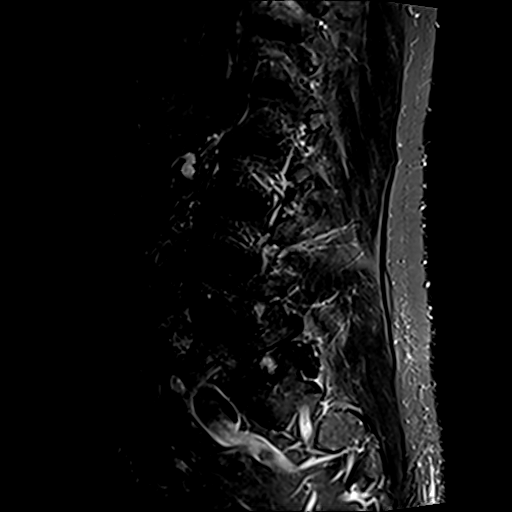

[Series 8: T2 · axial · 4.0mm · 0.70mm/px · z∈[-87,+154]mm · 9 of 37 slices shown (2 of 2)]
[im 1/37]
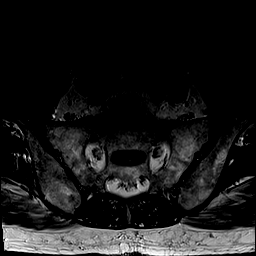
[im 6/37]
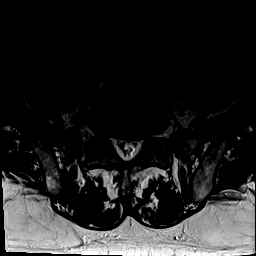
[im 11/37]
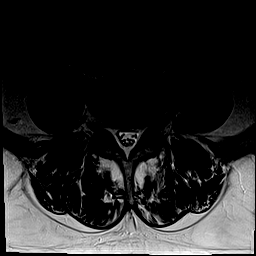
[im 16/37]
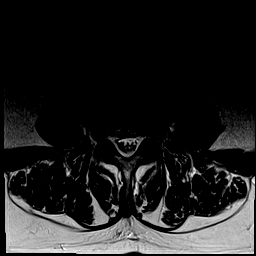
[im 19/37]
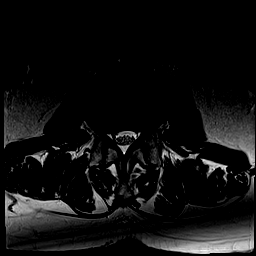
[im 21/37]
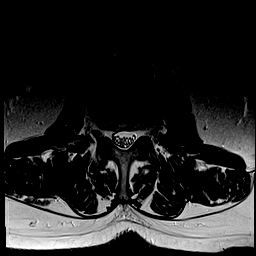
[im 26/37]
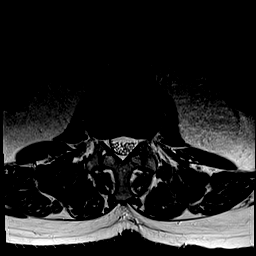
[im 31/37]
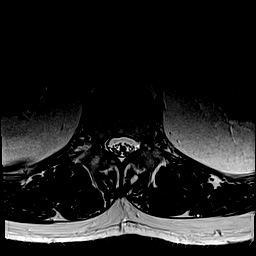
[im 37/37]
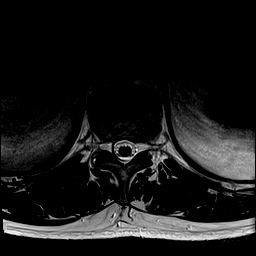

[Series 9: T1 · axial · 4.0mm · 0.35mm/px · z∈[-87,+154]mm · 9 of 37 slices shown (2 of 2)]
[im 1/37]
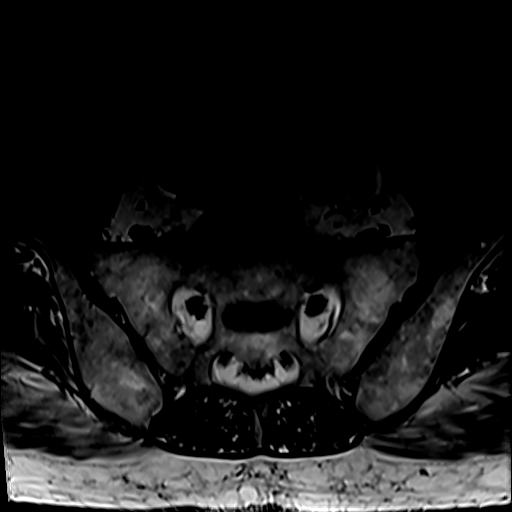
[im 6/37]
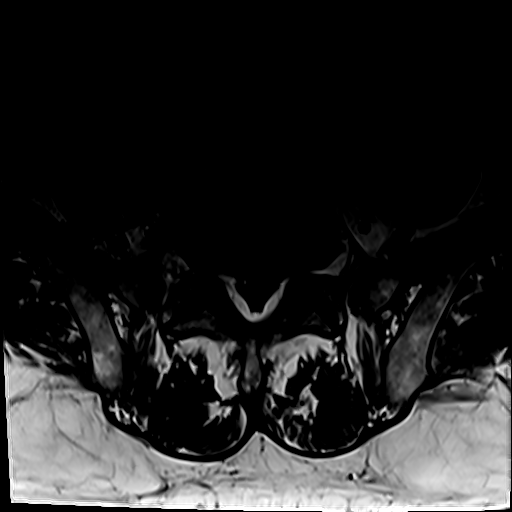
[im 11/37]
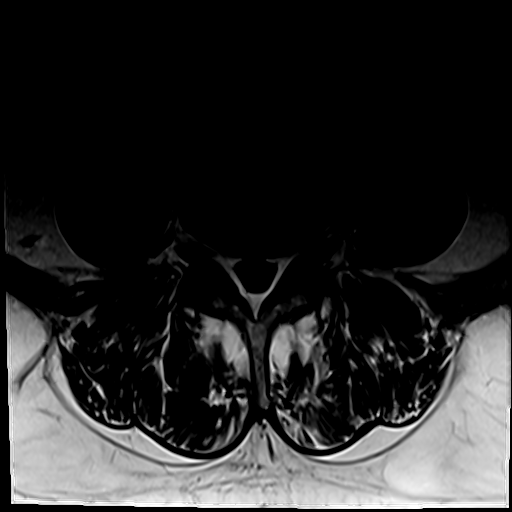
[im 16/37]
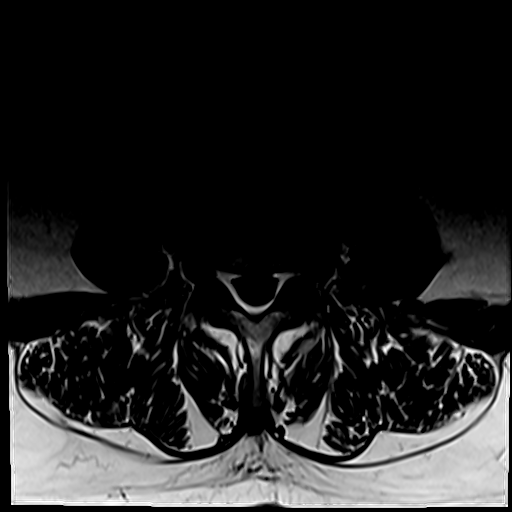
[im 19/37]
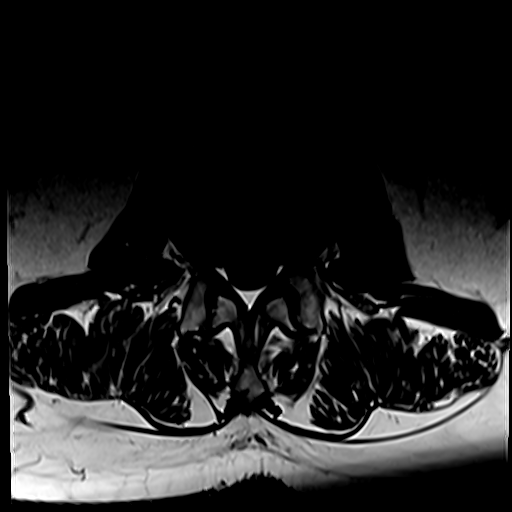
[im 21/37]
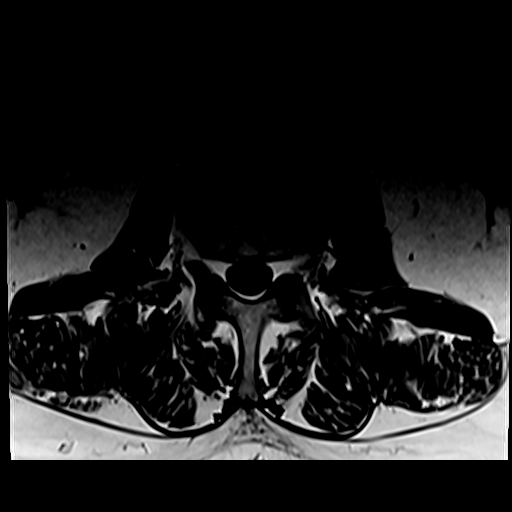
[im 26/37]
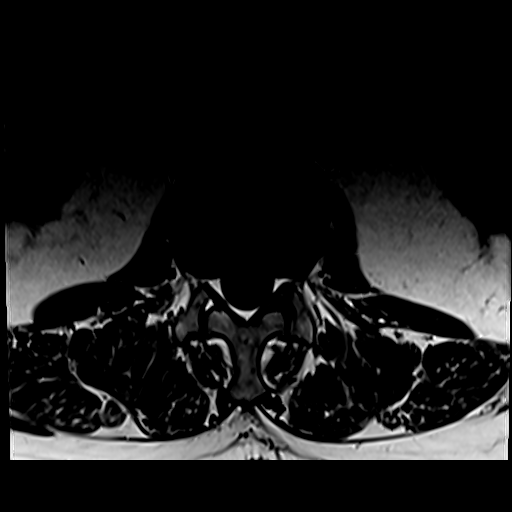
[im 31/37]
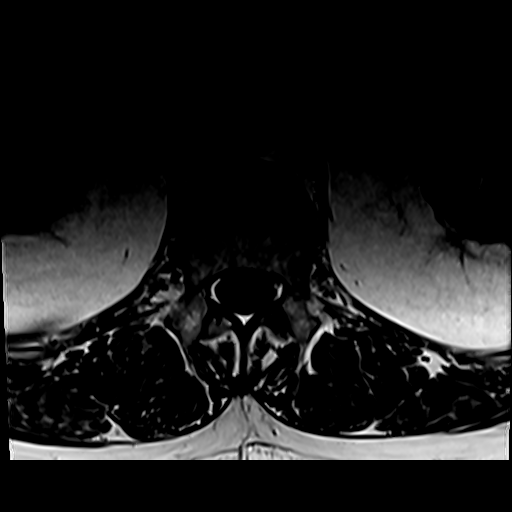
[im 37/37]
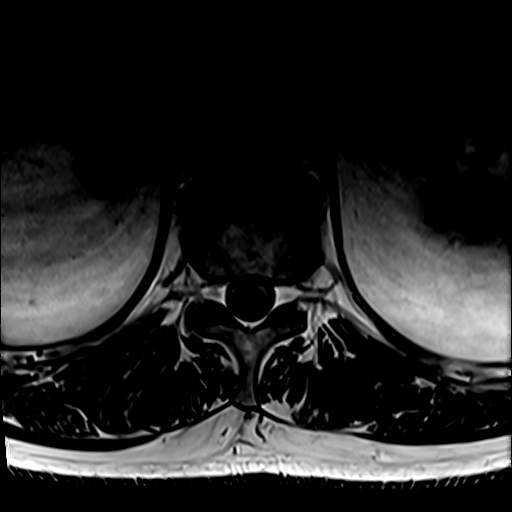

[32 of 48 positions shown; findings below may reference images not displayed]

FINDINGS: Segmentation:  Normal

Alignment:  Mild retrolisthesis L1-2 and L3-4.

Vertebrae:  Normal bone marrow.  Negative for fracture or mass.

Conus medullaris and cauda equina: Conus extends to the L1-2 level.
Conus and cauda equina appear normal.

Paraspinal and other soft tissues: Negative for paraspinous mass or
adenopathy.

Disc levels:

T11-12: Small left paracentral disc protrusion without significant
stenosis. Mild facet degeneration

T12-L1: Small left paracentral disc protrusion. Negative for
stenosis. Mild facet degeneration

L1-2: Disc degeneration with endplate spurring. Mild subarticular
stenosis on the left.

L2-3: Disc degeneration with disc bulging and diffuse endplate
spurring. Small extraforaminal disc protrusion on the left with mild
displacement of the left L2 nerve root. Mild facet degeneration.
Mild spinal stenosis.

L3-4: Disc degeneration with disc bulging and endplate spurring.
Mild facet degeneration. Moderate spinal stenosis and moderate
subarticular stenosis bilaterally.

L4-5: Disc degeneration with diffuse endplate spurring. Moderate
subarticular stenosis bilaterally. Mild spinal stenosis.

L5-S1: Disc degeneration with diffuse endplate spurring. Moderate
subarticular and foraminal stenosis bilaterally due to spurring.
IMPRESSION: Multilevel degenerative change throughout the lumbar spine.
Multilevel spinal and foraminal stenosis as described above

Negative for fracture or acute bony abnormality.

## 2021-04-01 DIAGNOSIS — E1165 Type 2 diabetes mellitus with hyperglycemia: Secondary | ICD-10-CM | POA: Diagnosis not present

## 2021-04-01 DIAGNOSIS — N529 Male erectile dysfunction, unspecified: Secondary | ICD-10-CM | POA: Diagnosis not present

## 2021-04-01 DIAGNOSIS — F1021 Alcohol dependence, in remission: Secondary | ICD-10-CM | POA: Diagnosis not present

## 2021-04-01 DIAGNOSIS — G4700 Insomnia, unspecified: Secondary | ICD-10-CM | POA: Diagnosis not present

## 2021-04-12 ENCOUNTER — Ambulatory Visit
Admission: EM | Admit: 2021-04-12 | Discharge: 2021-04-12 | Disposition: A | Discharge status: Home / Self Care | Payer: PPO | Attending: Emergency Medicine | Admitting: Emergency Medicine

## 2021-04-12 ENCOUNTER — Encounter: Payer: Self-pay | Admitting: *Deleted

## 2021-04-12 ENCOUNTER — Other Ambulatory Visit: Payer: Self-pay

## 2021-04-12 DIAGNOSIS — J209 Acute bronchitis, unspecified: Secondary | ICD-10-CM

## 2021-04-12 DIAGNOSIS — R059 Cough, unspecified: Secondary | ICD-10-CM | POA: Diagnosis not present

## 2021-04-12 MED ORDER — ALBUTEROL SULFATE HFA 108 (90 BASE) MCG/ACT IN AERS: 1.0000 | INHALATION_SPRAY | Freq: Four times a day (QID) | RESPIRATORY_TRACT | 0 refills | Status: AC | PRN

## 2021-04-12 MED ORDER — BENZONATATE 100 MG PO CAPS: 100.0000 mg | ORAL_CAPSULE | Freq: Three times a day (TID) | ORAL | 0 refills | Status: AC

## 2021-04-12 MED ORDER — PREDNISONE 10 MG (21) PO TBPK: ORAL_TABLET | Freq: Every day | ORAL | 0 refills | Status: AC

## 2021-04-12 MED ORDER — AZITHROMYCIN 250 MG PO TABS: 250.0000 mg | ORAL_TABLET | Freq: Every day | ORAL | 0 refills | Status: AC

## 2021-04-12 NOTE — ED Triage Notes (Signed)
C/O chest congestion and cough x 3-4 wks without fever.  States presents today "because I got tired of it".

## 2021-04-12 NOTE — ED Provider Notes (Signed)
[facility]   [id number] 04/12/21 Arrival Time: 8182   CC: Cough  SUBJECTIVE: History from: patient.  Christian Woodward is a 70 y.o. male who presents with productive cough with yellow sputum and congestion x 3-4 weeks.  Denies precipitating event.  Has tried OTC medications without relief.  Denies aggravating factors.  Reports previous symptoms in the past with bronchitis that improved with steroid and z-pak.   Denies fever, chills, chest pain, nausea, changes in bowel or bladder habits.    ROS: As per HPI.  All other pertinent ROS negative.     Past Medical History:  Diagnosis Date  . Diabetes ([place])   . HTN (hypertension)    Past Surgical History:  Procedure Laterality Date  . BIOPSY  12/10/2020   Procedure: BIOPSY;  Surgeon: Eloise Harman, DO;  Location: [facility];  Service: Endoscopy;;  . COLONOSCOPY    . COLONOSCOPY WITH PROPOFOL N/A 12/10/2020   Procedure: COLONOSCOPY WITH PROPOFOL;  Surgeon: Eloise Harman, DO;  Location: [facility];  Service: Endoscopy;  Laterality: N/A;  10:00am  . HERNIA REPAIR    . PARTIAL COLECTOMY N/A 01/27/2021   Procedure: RIGHT HEMI-COLECTOMY;  Surgeon: Aviva Signs, MD;  Location: [facility];  Service: General;  Laterality: N/A;  . POLYPECTOMY  12/10/2020   Procedure: POLYPECTOMY;  Surgeon: Eloise Harman, DO;  Location: [facility];  Service: Endoscopy;;  . Clide Deutscher  12/10/2020   Procedure: Clide Deutscher;  Surgeon: Eloise Harman, DO;  Location: [facility];  Service: Endoscopy;;   Allergies  Allergen Reactions  . Poison Oak Extract    No current facility-administered medications on file prior to encounter.   Current Outpatient Medications on File Prior to Encounter  Medication Sig Dispense Refill  . glipiZIDE (GLUCOTROL XL) 10 MG 24 hr tablet Take 10 mg by mouth daily.    Marland Kitchen diltiazem (CARDIZEM) 120 MG tablet Take 120 mg by mouth 2 (two) times daily.    Marland Kitchen ibuprofen (ADVIL) 200 MG tablet Take 400 mg by  mouth every 6 (six) hours as needed for headache or moderate pain.    Marland Kitchen lisinopril (ZESTRIL) 20 MG tablet Take 20 mg by mouth daily.    . metFORMIN (GLUCOPHAGE-XR) 500 MG 24 hr tablet Take 1,000 mg by mouth 2 (two) times daily.    . pioglitazone (ACTOS) 45 MG tablet Take 45 mg by mouth every morning.    . pravastatin (PRAVACHOL) 40 MG tablet Take 40 mg by mouth daily.    . tadalafil (CIALIS) 20 MG tablet Take 20 mg by mouth daily as needed for erectile dysfunction.    . traMADol (ULTRAM) 50 MG tablet Take 1 tablet (50 mg total) by mouth every 6 (six) hours as needed. 25 tablet 0  . traZODone (DESYREL) 100 MG tablet Take 100 mg by mouth at bedtime.     Social History   Socioeconomic History  . Marital status: Legally Separated    Spouse name: Not on file  . Number of children: Not on file  . Years of education: Not on file  . Highest education level: Not on file  Occupational History  . Not on file  Tobacco Use  . Smoking status: Never Smoker  . Smokeless tobacco: Never Used  Vaping Use  . Vaping Use: Never used  Substance and Sexual Activity  . Alcohol use: Not Currently  . Drug use: Never  . Sexual activity: Not on file  Other Topics Concern  . Not on  file  Social History Narrative  . Not on file   Social Determinants of Health   Financial Resource Strain: Not on file  Food Insecurity: Not on file  Transportation Needs: Not on file  Physical Activity: Not on file  Stress: Not on file  Social Connections: Not on file  Intimate Partner Violence: Not on file   Family History  Adopted: Yes    OBJECTIVE:  Vitals:   04/12/21 1600  BP: (!) 107/54  Pulse: 96  Resp: 20  Temp: (!) 97.5 F (36.4 C)  TempSrc: Temporal  SpO2: 97%     General appearance: alert; appears fatigued, but nontoxic; speaking in full sentences and tolerating own secretions HEENT: NCAT; Ears: EACs clear, TMs pearly gray; Eyes: PERRL.  EOM grossly intact. Nose: nares patent without rhinorrhea,  Throat: oropharynx clear, tonsils non erythematous or enlarged, uvula midline  Neck: supple without LAD Lungs: unlabored respirations, symmetrical air entry; cough: mild; no respiratory distress; wheezes and rhonchi heard throughout bilateral lung fields Heart: regular rate and rhythm.   Skin: warm and dry Psychological: alert and cooperative; normal mood and affect  ASSESSMENT & PLAN:  1. Cough   2. Acute bronchitis, unspecified organism     Meds ordered this encounter  Medications  . predniSONE (STERAPRED UNI-PAK 21 TAB) 10 MG (21) TBPK tablet    Sig: Take by mouth daily. Take 6 tabs by mouth daily  for 2 days, then 5 tabs for 2 days, then 4 tabs for 2 days, then 3 tabs for 2 days, 2 tabs for 2 days, then 1 tab by mouth daily for 2 days    Dispense:  42 tablet    Refill:  0    Order Specific Question:   Supervising Provider    Answer:   Raylene Everts [[id number]]  . benzonatate (TESSALON) 100 MG capsule    Sig: Take 1 capsule (100 mg total) by mouth every 8 (eight) hours.    Dispense:  21 capsule    Refill:  0    Order Specific Question:   Supervising Provider    Answer:   Raylene Everts [[id number]]  . azithromycin (ZITHROMAX) 250 MG tablet    Sig: Take 1 tablet (250 mg total) by mouth daily. Take first 2 tablets together, then 1 every day until finished.    Dispense:  6 tablet    Refill:  0    Order Specific Question:   Supervising Provider    Answer:   Raylene Everts [[id number]]    Get rest, and drink fluids Continue to use albuterol inhaler as prescribed Prednisone prescribed.  Take as directed and to completion Prescribed tessalone perles as needed for cough.  Azithromycin prescribed take as directed and to completion Use throat lozenges, hot tea, honey, and avoid second-hand smoke  Follow up with PCP this week for recheck and ensure symptoms are improving Return or go to the ED if you have any new or worsening symptoms such as fever, worsening cough, chest pain,  etc...  Reviewed expectations re: course of current medical issues. Questions answered. Outlined signs and symptoms indicating need for more acute intervention. Patient verbalized understanding. After Visit Summary given.         Lestine Box, PA-C 04/12/21 1611

## 2021-04-12 NOTE — Discharge Instructions (Signed)
Get rest, and drink fluids Continue to use albuterol inhaler as prescribed Prednisone prescribed.  Take as directed and to completion Prescribed tessalone perles as needed for cough.  Azithromycin prescribed take as directed and to completion Use throat lozenges, hot tea, honey, and avoid second-hand smoke  Follow up with PCP this week for recheck and ensure symptoms are improving Return or go to the ED if you have any new or worsening symptoms such as fever, worsening cough, chest pain, etc..Marland Kitchen

## 2021-05-06 ENCOUNTER — Ambulatory Visit
Admission: EM | Admit: 2021-05-06 | Discharge: 2021-05-06 | Disposition: A | Discharge status: Home / Self Care | Payer: PPO | Attending: Family Medicine | Admitting: Family Medicine

## 2021-05-06 DIAGNOSIS — J069 Acute upper respiratory infection, unspecified: Secondary | ICD-10-CM

## 2021-05-06 DIAGNOSIS — J209 Acute bronchitis, unspecified: Secondary | ICD-10-CM

## 2021-05-06 MED ORDER — GUAIFENESIN-CODEINE 100-10 MG/5ML PO SYRP: 5.0000 mL | ORAL_SOLUTION | Freq: Three times a day (TID) | ORAL | 0 refills | Status: AC | PRN

## 2021-05-06 MED ORDER — AMOXICILLIN-POT CLAVULANATE 875-125 MG PO TABS: 1.0000 | ORAL_TABLET | Freq: Two times a day (BID) | ORAL | 0 refills | Status: AC

## 2021-05-06 MED ORDER — METHYLPREDNISOLONE SODIUM SUCC 125 MG IJ SOLR
125.0000 mg | Freq: Once | INTRAMUSCULAR | Status: AC
  Administered 2021-05-06: 125 mg via INTRAMUSCULAR

## 2021-05-06 NOTE — ED Provider Notes (Signed)
[facility]    CSN: [id number] Arrival date & time: 05/06/21  0815      History   Chief Complaint Chief Complaint  Patient presents with  . Cough    HPI Christian Woodward is a 70 y.o. male.   Reports cough, nasal congestion and chest congestion for the last week. Denies smoking.  Denies sick contacts.  Has negative hx Covid.  Has not completed COVID vaccines.  Has not completed flu vaccine.  Has not attempted OTC treatment.  States he was seen in this office 5 weeks ago for bronchitis.  States that this feels pretty much the same.  Denies shortness of breath, abdominal pain, nausea, vomiting, diarrhea, rash, fever, other symptoms.  ROS per HPI  The history is provided by the patient.  Cough   Past Medical History:  Diagnosis Date  . Diabetes ([place])   . HTN (hypertension)     Patient Active Problem List   Diagnosis Date Noted  . S/P partial colectomy 01/27/2021  . Adenomatous polyp of ascending colon   . Colon cancer screening 09/05/2020  . Esophageal reflux 09/05/2020    Past Surgical History:  Procedure Laterality Date  . BIOPSY  12/10/2020   Procedure: BIOPSY;  Surgeon: Eloise Harman, DO;  Location: [facility];  Service: Endoscopy;;  . COLONOSCOPY    . COLONOSCOPY WITH PROPOFOL N/A 12/10/2020   Procedure: COLONOSCOPY WITH PROPOFOL;  Surgeon: Eloise Harman, DO;  Location: [facility];  Service: Endoscopy;  Laterality: N/A;  10:00am  . HERNIA REPAIR    . PARTIAL COLECTOMY N/A 01/27/2021   Procedure: RIGHT HEMI-COLECTOMY;  Surgeon: Aviva Signs, MD;  Location: [facility];  Service: General;  Laterality: N/A;  . POLYPECTOMY  12/10/2020   Procedure: POLYPECTOMY;  Surgeon: Eloise Harman, DO;  Location: [facility];  Service: Endoscopy;;  . Clide Deutscher  12/10/2020   Procedure: Clide Deutscher;  Surgeon: Eloise Harman, DO;  Location: [facility];  Service: Endoscopy;;       Home Medications    Prior to Admission medications    Medication Sig Start Date End Date Taking? Authorizing Provider  amoxicillin-clavulanate (AUGMENTIN) 875-125 MG tablet Take 1 tablet by mouth 2 (two) times daily for 7 days. 05/06/21 05/13/21 Yes Faustino Congress, NP  guaiFENesin-codeine (ROBITUSSIN AC) 100-10 MG/5ML syrup Take 5 mLs by mouth 3 (three) times daily as needed for cough. 05/06/21  Yes Faustino Congress, NP  albuterol (VENTOLIN HFA) 108 (90 Base) MCG/ACT inhaler Inhale 1-2 puffs into the lungs every 6 (six) hours as needed for wheezing or shortness of breath. 04/12/21   Wurst, [place], PA-C  azithromycin (ZITHROMAX) 250 MG tablet Take 1 tablet (250 mg total) by mouth daily. Take first 2 tablets together, then 1 every day until finished. 04/12/21   Wurst, [place], PA-C  benzonatate (TESSALON) 100 MG capsule Take 1 capsule (100 mg total) by mouth every 8 (eight) hours. 04/12/21   Wurst, [place], PA-C  diltiazem (CARDIZEM) 120 MG tablet Take 120 mg by mouth 2 (two) times daily. 06/13/20   [provider]  glipiZIDE (GLUCOTROL XL) 10 MG 24 hr tablet Take 10 mg by mouth daily. 08/22/20   [provider]  ibuprofen (ADVIL) 200 MG tablet Take 400 mg by mouth every 6 (six) hours as needed for headache or moderate pain.    [provider]  lisinopril (ZESTRIL) 20 MG tablet Take 20 mg by mouth daily. 07/07/20   [provider]  metFORMIN (GLUCOPHAGE-XR) 500 MG  24 hr tablet Take 1,000 mg by mouth 2 (two) times daily. 06/13/20   [provider]  pioglitazone (ACTOS) 45 MG tablet Take 45 mg by mouth every morning. 08/01/20   [provider]  pravastatin (PRAVACHOL) 40 MG tablet Take 40 mg by mouth daily. 06/13/20   [provider]  predniSONE (STERAPRED UNI-PAK 21 TAB) 10 MG (21) TBPK tablet Take by mouth daily. Take 6 tabs by mouth daily  for 2 days, then 5 tabs for 2 days, then 4 tabs for 2 days, then 3 tabs for 2 days, 2 tabs for 2 days, then 1 tab by mouth daily for 2 days 04/12/21   Stacey Drain,  [place], PA-C  tadalafil (CIALIS) 20 MG tablet Take 20 mg by mouth daily as needed for erectile dysfunction. 11/09/20   [provider]  traMADol (ULTRAM) 50 MG tablet Take 1 tablet (50 mg total) by mouth every 6 (six) hours as needed. 01/29/21   Aviva Signs, MD  traZODone (DESYREL) 100 MG tablet Take 100 mg by mouth at bedtime.    [provider]    Family History Family History  Adopted: Yes    Social History Social History   Tobacco Use  . Smoking status: Never Smoker  . Smokeless tobacco: Never Used  Vaping Use  . Vaping Use: Never used  Substance Use Topics  . Alcohol use: Not Currently  . Drug use: Never     Allergies   Poison oak extract   Review of Systems Review of Systems  Respiratory: Positive for cough.      Physical Exam Triage Vital Signs ED Triage Vitals  Enc Vitals Group     BP 05/06/21 0844 123/83     Pulse Rate 05/06/21 0844 83     Resp --      Temp 05/06/21 0844 98.7 F (37.1 C)     Temp Source 05/06/21 0844 Oral     SpO2 05/06/21 0844 97 %     Weight --      Height --      Head Circumference --      Peak Flow --      Pain Score 05/06/21 0846 0     Pain Loc --      Pain Edu? --      Excl. in [place]? --    No data found.  Updated Vital Signs BP 123/83 (BP Location: Right Arm)   Pulse 83   Temp 98.7 F (37.1 C) (Oral)   SpO2 97%   Visual Acuity Right Eye Distance:   Left Eye Distance:   Bilateral Distance:    Right Eye Near:   Left Eye Near:    Bilateral Near:     Physical Exam Vitals and nursing note reviewed.  Constitutional:      Appearance: He is well-developed and normal weight. He is ill-appearing.  HENT:     Head: Normocephalic and atraumatic.     Right Ear: Tympanic membrane, ear canal and external ear normal.     Left Ear: Tympanic membrane, ear canal and external ear normal.     Nose: Congestion present.     Mouth/Throat:     Mouth: Mucous membranes are moist.     Pharynx: Posterior  oropharyngeal erythema present.  Eyes:     Extraocular Movements: Extraocular movements intact.     Conjunctiva/sclera: Conjunctivae normal.     Pupils: Pupils are equal, round, and reactive to light.  Cardiovascular:     Rate and  Rhythm: Normal rate and regular rhythm.     Heart sounds: Normal heart sounds. No murmur heard.   Pulmonary:     Effort: Pulmonary effort is normal. No respiratory distress.     Breath sounds: No stridor. Wheezing present. No rhonchi or rales.  Chest:     Chest wall: No tenderness.  Musculoskeletal:        General: Normal range of motion.     Cervical back: Normal range of motion and neck supple.  Skin:    General: Skin is warm and dry.     Capillary Refill: Capillary refill takes less than 2 seconds.  Neurological:     General: No focal deficit present.     Mental Status: He is alert and oriented to person, place, and time.  Psychiatric:        Mood and Affect: Mood normal.        Behavior: Behavior normal.        Thought Content: Thought content normal.      UC Treatments / Results  Labs (all labs ordered are listed, but only abnormal results are displayed) Labs Reviewed - No data to display  EKG   Radiology No results found.  Procedures Procedures (including critical care time)  Medications Ordered in UC Medications  methylPREDNISolone sodium succinate (SOLU-MEDROL) 125 mg/2 mL injection 125 mg (125 mg Intramuscular Given 05/06/21 0952)    Initial Impression / Assessment and Plan / UC Course  I have reviewed the triage vital signs and the nursing notes.  Pertinent labs & imaging results that were available during my care of the patient were reviewed by me and considered in my medical decision making (see chart for details).    Acute bronchitis URI with cough and congestion  Solu-Medrol 125 mg IM given in office today Augmentin 875 mg twice daily x7 days prescribed Take as directed and to completion May take Tylenol and  ibuprofen as needed for aches, fever Push fluids and plenty of rest Follow up with this office or with primary care if symptoms are persisting.  Follow up in the ER for high fever, trouble swallowing, trouble breathing, other concerning symptoms.   Final Clinical Impressions(s) / UC Diagnoses   Final diagnoses:  Acute bronchitis, unspecified organism  URI with cough and congestion     Discharge Instructions     I have sent in Augmentin for you to take twice a day for 7 days.  You have received a steroid injection in the office today to help open you up  I have sent in cough syrup for you to take. This medication can make you sleepy. Do not drive while taking this medication.  Follow up with this office or with primary care if symptoms are persisting.  Follow up in the ER for high fever, trouble swallowing, trouble breathing, other concerning symptoms.      ED Prescriptions    Medication Sig Dispense Auth. Provider   guaiFENesin-codeine (ROBITUSSIN AC) 100-10 MG/5ML syrup Take 5 mLs by mouth 3 (three) times daily as needed for cough. 120 mL Faustino Congress, NP   amoxicillin-clavulanate (AUGMENTIN) 875-125 MG tablet Take 1 tablet by mouth 2 (two) times daily for 7 days. 14 tablet Faustino Congress, NP     I have reviewed the PDMP during this encounter.   Faustino Congress, NP 05/10/21 1507

## 2021-05-06 NOTE — ED Triage Notes (Signed)
Pt reports cough, nasal congestion  and chest congestion x 1 week. Denies chest pain, fever.  Pt not taken any OTC meds for complaint.  Reports he was seeing for bronchitis 5 weeks ago.

## 2021-05-06 NOTE — Discharge Instructions (Addendum)
I have sent in Augmentin for you to take twice a day for 7 days.  You have received a steroid injection in the office today to help open you up  I have sent in cough syrup for you to take. This medication can make you sleepy. Do not drive while taking this medication.  Follow up with this office or with primary care if symptoms are persisting.  Follow up in the ER for high fever, trouble swallowing, trouble breathing, other concerning symptoms.

## 2021-10-28 DIAGNOSIS — Z23 Encounter for immunization: Secondary | ICD-10-CM | POA: Diagnosis not present

## 2021-10-28 DIAGNOSIS — F419 Anxiety disorder, unspecified: Secondary | ICD-10-CM | POA: Diagnosis not present

## 2021-10-28 DIAGNOSIS — I10 Essential (primary) hypertension: Secondary | ICD-10-CM | POA: Diagnosis not present

## 2021-10-28 DIAGNOSIS — E1165 Type 2 diabetes mellitus with hyperglycemia: Secondary | ICD-10-CM | POA: Diagnosis not present

## 2021-10-28 DIAGNOSIS — E781 Pure hyperglyceridemia: Secondary | ICD-10-CM | POA: Diagnosis not present

## 2024-05-12 ENCOUNTER — Telehealth: Payer: Self-pay

## 2024-05-12 NOTE — Telephone Encounter (Signed)
 Please call and schedule pt an appt to be seen. Last visit was in 2021

## 2024-05-12 NOTE — Telephone Encounter (Signed)
 noted

## 2024-07-20 NOTE — Telephone Encounter (Signed)
 Several attempts to contact patient. No response. Letter mailed.

## 2024-08-16 ENCOUNTER — Telehealth: Payer: Self-pay | Admitting: *Deleted

## 2024-08-16 NOTE — Telephone Encounter (Signed)
 Surgical Date: 01/27/2021 Procedure: RIGHT HEMI-COLECTOMY   Final pathology revealed no malignancy.   Received call from patient [phone]~ telephone.   Patient inquired as to when he should have repeat colonoscopy.   Please advise.

## 2024-08-22 NOTE — Telephone Encounter (Signed)
 Call placed to patient and patient made aware.   Message routed to Dr. Cindie for recommendations.   Last colonoscopy 12/10/2020.

## 2024-08-23 ENCOUNTER — Telehealth: Payer: Self-pay | Admitting: *Deleted

## 2024-08-23 NOTE — Telephone Encounter (Signed)
 Discussed with Dr. Cindie. Recommended repeat colonoscopy 3 years after colectomy due to the number and size of polyps noted.   [facility] will contact patient to schedule OV for scheduling.   Call placed to patient and patient made aware.

## 2024-08-23 NOTE — Telephone Encounter (Signed)
 LMOVM to call back to schedule appt with Dr. Cindie to discuss scheduling colonoscopy

## 2024-08-24 NOTE — Telephone Encounter (Signed)
 LMOVM to call back

## 2024-08-28 NOTE — Telephone Encounter (Signed)
 Christian Woodward, pt needs OV with Dr. Cindie. Thanks!

## 2024-08-28 NOTE — Telephone Encounter (Signed)
 LMOVM to call back

## 2024-08-29 NOTE — Telephone Encounter (Signed)
 Okay if you can mail him a letter to call to schedule and appointment. Thank you
